# Patient Record
Sex: Male | Born: 1975
Health system: Southern US, Community
[De-identification: ages and names within clinical notes are randomized; demographics above are authoritative.]

## PROBLEM LIST (undated history)

## (undated) DIAGNOSIS — F32A Depression, unspecified: Secondary | ICD-10-CM

## (undated) DIAGNOSIS — F419 Anxiety disorder, unspecified: Secondary | ICD-10-CM

## (undated) DIAGNOSIS — Z87442 Personal history of urinary calculi: Secondary | ICD-10-CM

## (undated) DIAGNOSIS — F329 Major depressive disorder, single episode, unspecified: Secondary | ICD-10-CM

## (undated) HISTORY — DX: Anxiety disorder, unspecified: F41.9

## (undated) HISTORY — DX: Depression, unspecified: F32.A

## (undated) HISTORY — DX: Major depressive disorder, single episode, unspecified: F32.9

---

## 2005-01-14 ENCOUNTER — Ambulatory Visit: Payer: Self-pay | Admitting: Family Medicine

## 2005-03-14 ENCOUNTER — Ambulatory Visit: Payer: Self-pay | Admitting: Internal Medicine

## 2005-06-07 ENCOUNTER — Ambulatory Visit: Payer: Self-pay | Admitting: Family Medicine

## 2005-06-13 ENCOUNTER — Ambulatory Visit: Payer: Self-pay | Admitting: Family Medicine

## 2005-09-11 ENCOUNTER — Ambulatory Visit: Payer: Self-pay | Admitting: Family Medicine

## 2005-09-27 ENCOUNTER — Ambulatory Visit: Payer: Self-pay | Admitting: Family Medicine

## 2006-10-16 ENCOUNTER — Ambulatory Visit: Payer: Self-pay | Admitting: Family Medicine

## 2006-12-02 HISTORY — PX: LITHOTRIPSY: SUR834

## 2007-02-27 ENCOUNTER — Ambulatory Visit: Payer: Self-pay | Admitting: Family Medicine

## 2007-10-22 ENCOUNTER — Ambulatory Visit: Payer: Self-pay | Admitting: Family Medicine

## 2008-09-21 ENCOUNTER — Ambulatory Visit: Payer: Self-pay | Admitting: Family Medicine

## 2008-09-22 ENCOUNTER — Ambulatory Visit: Payer: Self-pay | Admitting: Family Medicine

## 2008-09-22 DIAGNOSIS — R1011 Right upper quadrant pain: Secondary | ICD-10-CM

## 2008-09-22 DIAGNOSIS — Z87448 Personal history of other diseases of urinary system: Secondary | ICD-10-CM

## 2008-09-22 LAB — CONVERTED CEMR LAB
Glucose, Urine, Semiquant: NEGATIVE
Nitrite: NEGATIVE
Urobilinogen, UA: 1
WBC Urine, dipstick: NEGATIVE
pH: 5

## 2008-09-24 ENCOUNTER — Encounter: Payer: Self-pay | Admitting: Family Medicine

## 2008-09-27 ENCOUNTER — Encounter (INDEPENDENT_AMBULATORY_CARE_PROVIDER_SITE_OTHER): Payer: Self-pay | Admitting: *Deleted

## 2008-09-27 LAB — CONVERTED CEMR LAB
ALT: 18 units/L (ref 0–53)
AST: 18 units/L (ref 0–37)
Albumin: 4.2 g/dL (ref 3.5–5.2)
Alkaline Phosphatase: 67 units/L (ref 39–117)
BUN: 17 mg/dL (ref 6–23)
Calcium: 9.4 mg/dL (ref 8.4–10.5)
GFR calc Af Amer: 99 mL/min
Glucose, Bld: 75 mg/dL (ref 70–99)
Hemoglobin: 15.6 g/dL (ref 13.0–17.0)
Monocytes Absolute: 0.5 10*3/uL (ref 0.1–1.0)
Neutrophils Relative %: 53.4 % (ref 43.0–77.0)
Potassium: 4 meq/L (ref 3.5–5.1)
RBC: 5.01 M/uL (ref 4.22–5.81)
Total Bilirubin: 1 mg/dL (ref 0.3–1.2)
Total Protein: 7.2 g/dL (ref 6.0–8.3)

## 2008-09-28 ENCOUNTER — Encounter: Admission: RE | Admit: 2008-09-28 | Discharge: 2008-09-28 | Payer: Self-pay | Admitting: Family Medicine

## 2008-09-29 ENCOUNTER — Telehealth: Payer: Self-pay | Admitting: Family Medicine

## 2008-09-29 DIAGNOSIS — N200 Calculus of kidney: Secondary | ICD-10-CM

## 2008-10-11 ENCOUNTER — Encounter: Payer: Self-pay | Admitting: Family Medicine

## 2008-11-03 ENCOUNTER — Encounter: Payer: Self-pay | Admitting: Family Medicine

## 2008-12-05 ENCOUNTER — Ambulatory Visit (HOSPITAL_COMMUNITY): Admission: RE | Admit: 2008-12-05 | Discharge: 2008-12-05 | Payer: Self-pay | Admitting: Urology

## 2008-12-13 ENCOUNTER — Encounter: Payer: Self-pay | Admitting: Family Medicine

## 2008-12-14 ENCOUNTER — Encounter: Payer: Self-pay | Admitting: Family Medicine

## 2008-12-16 ENCOUNTER — Encounter: Payer: Self-pay | Admitting: Family Medicine

## 2009-07-25 ENCOUNTER — Encounter: Payer: Self-pay | Admitting: Family Medicine

## 2009-09-14 ENCOUNTER — Ambulatory Visit: Payer: Self-pay | Admitting: Family Medicine

## 2009-12-04 ENCOUNTER — Ambulatory Visit: Payer: Self-pay | Admitting: Diagnostic Radiology

## 2009-12-04 ENCOUNTER — Ambulatory Visit: Payer: Self-pay | Admitting: Family Medicine

## 2009-12-04 ENCOUNTER — Ambulatory Visit (HOSPITAL_BASED_OUTPATIENT_CLINIC_OR_DEPARTMENT_OTHER): Admission: RE | Admit: 2009-12-04 | Discharge: 2009-12-04 | Payer: Self-pay | Admitting: Family Medicine

## 2009-12-04 ENCOUNTER — Telehealth (INDEPENDENT_AMBULATORY_CARE_PROVIDER_SITE_OTHER): Payer: Self-pay | Admitting: *Deleted

## 2009-12-04 DIAGNOSIS — M25569 Pain in unspecified knee: Secondary | ICD-10-CM | POA: Insufficient documentation

## 2010-08-22 ENCOUNTER — Encounter: Payer: Self-pay | Admitting: Family Medicine

## 2010-09-11 ENCOUNTER — Ambulatory Visit: Payer: Self-pay | Admitting: Family Medicine

## 2011-01-01 NOTE — Assessment & Plan Note (Signed)
Summary: FLU SHOT//KN  Nurse Visit   Allergies: No Known Drug Allergies  Orders Added: 1)  Admin 1st Vaccine [90471] 2)  Flu Vaccine 85yrs + [57846] Flu Vaccine Consent Questions     Do you have a history of severe allergic reactions to this vaccine? no    Any prior history of allergic reactions to egg and/or gelatin? no    Do you have a sensitivity to the preservative Thimersol? no    Do you have a past history of Guillan-Barre Syndrome? no    Do you currently have an acute febrile illness? no    Have you ever had a severe reaction to latex? no    Vaccine information given and explained to patient? yes    Are you currently pregnant? no    Lot Number:[id number]   Exp Date:06/01/2011   Site Given  Right Deltoid IM .lbflu

## 2011-01-01 NOTE — Assessment & Plan Note (Signed)
Summary: pain in knee/kdc ok per felecia   Vital Signs:  Patient profile:   35 year old male Height:      70 inches Weight:      221.38 pounds BMI:     31.88 Temp:     98.3 degrees F oral Pulse rate:   85 / minute Pulse rhythm:   regular BP sitting:   130 / 82  (left arm) Cuff size:   large  Vitals Entered By: Army Fossa CMA (December 04, 2009 10:00 AM) CC: Pt fell on new years eve down the stairs, his right knee is now very sore.    History of Present Illness:  Injury      This is a 35 year old man who presents with An injury.  The symptoms began 4 days ago.  Pt fell Friday am---slipped down 4 carpeted steps.  Pt fell on bottom twisted Left foot and ankle.  He saw Podiatrist this am for that.  Pt also c/o R knee pain. It has bothered him for 6 months but was worsened by fall.  The patient reports injury to the right knee and left foot, but denies injury to the head, face, neck, left arm, right arm, left elbow, right elbow, left forearm, right forearm, chest, back, abdomen, left hip, right hip, left thigh, right thigh, left knee, left leg, right leg, left ankle, right ankle, and right foot.  The patient also reports swelling.  The patient denies redness, tenderness, increased warmth deformity, blood loss, numbness, weakness, loss of sensation, coolness of extremity, and loss of consciousness.  The patient denies the following risk factors for significant bleeding: aspirin use, anticoagulant use, and history of bleeding disorder.    Current Medications (verified): 1)  None  Allergies (verified): No Known Drug Allergies  Past History:  Past medical, surgical, family and social histories (including risk factors) reviewed for relevance to current acute and chronic problems.  Family History: Reviewed history and no changes required.  Social History: Reviewed history from 09/22/2008 and no changes required. Married Never Smoked Drug use-no  Review of Systems      See  HPI  Physical Exam  General:  Well-developed,well-nourished,in no acute distress; alert,appropriate and cooperative throughout examination Msk:  decreased flexion R knee but only slightly affected when compared to L knee no joint tenderness, no joint warmth, no redness over joints, no joint deformities, and no crepitation.   + minimal swelling lateral R knee    + ecchymosis L big toe--- pt saw podiatrist this am Extremities:  No clubbing, cyanosis, edema, or deformity noted with normal full range of motion of all joints.     Impression & Recommendations:  Problem # 1:  KNEE PAIN, RIGHT (ICD-719.46)  Orders: T-Knee Right 2 view (16109UE) Orthopedic Surgeon Referral (Ortho Surgeon) Knee Orthosis Elastic Knee Cap [phone])  Discussed strengthening exercises, use of ice or heat, and medications.

## 2011-01-01 NOTE — Progress Notes (Signed)
Summary: knee injury  Phone Note Call from Patient Call back at Home Phone [phone]   Caller: Patient Summary of Call: Pt left VM that he has had a recent foot and knee injury and would like to see dr Laury Axon today. pt states that he has a podiatry that he is going to see today about the foot but would like to get in today to have dr lowne look at his knee.................Marland KitchenFelecia Deloach CMA  December 04, 2009 8:49 AM    pt coming in for OV today..............Marland KitchenFelecia Deloach CMA  December 04, 2009 8:51 AM

## 2011-01-01 NOTE — Letter (Signed)
Summary: [facility]  [facility]   Imported By: Lanelle Bal 09/03/2010 08:29:53  _____________________________________________________________________  External Attachment:    Type:   Image     Comment:   External Document

## 2011-04-16 NOTE — Op Note (Signed)
NAME:  Scott Sanchez, Scott Sanchez              ACCOUNT NO.:  [id number]   MEDICAL RECORD NO.:  [id number]          PATIENT TYPE:  AMB   LOCATION:  DAY                          FACILITY:  [facility]   PHYSICIAN:  Sigmund I. Patsi Sears, M.D.DATE OF BIRTH:  13-Aug-1976   DATE OF PROCEDURE:  12/05/2008  DATE OF DISCHARGE:                               OPERATIVE REPORT   PREOPERATIVE DIAGNOSIS:  Left renal pelvic stone.   POSTOPERATIVE DIAGNOSIS:  Left renal pelvic stone.   OPERATION:  Cystourethroscopy, left retrograde pyelogram with  interpretation, left double-J stent.   SURGEON:  Sigmund I. Patsi Sears, M.D.   ANESTHESIA:  General LMA.   PREPARATION:  After appropriate preanesthesia, the patient was brought  to the operating room, placed on the operating room in the dorsal supine  position where general LMA anesthesia was introduced.  He was then  replaced in the dorsal lithotomy position where the pubis was prepped  with Betadine solution and draped in the usual fashion.   REVIEW OF HISTORY:  Mr. Bisono is a 35 year old male with an 18 mm left  lower pole renal calculus, and 18-month history of left flank pain, and  recent onset of nausea, vomiting and gross hematuria.  He is now for  lithotripsy today with pre-lithotripsy double-J stent.   PROCEDURE:  Cystourethroscopy was accomplished, left retrograde  pyelograms was performed.  Left  retrograde shows no hydronephrosis, but  the patient was found to have a small intrarenal pelvis, with no  hydronephrosis.  The stone was identified on retrograde within the renal  pelvis, completely filling the renal pelvis.  The upper pole has a long  extended infundibulum, and the mid pole and lower pole both have short  infundibula.  A 6 x 26 double-J stent was passed into the renal pelvis,  and coiled in the bladder.  No bleeding was noted.  The patient was  given a B and O suppository.  He was awakened and taken to the recovery  room in good  condition.      Sigmund I. Patsi Sears, M.D.  Electronically Signed     SIT/MEDQ  D:  12/05/2008  T:  12/05/2008  Job:  [id number]

## 2011-10-02 IMAGING — CR DG KNEE 1-2V*R*
2 series · 2 of 2 positions shown · non-contrast
Comparison: None available.

CLINICAL DATA: Anterior knee pain since fall.

RIGHT KNEE - 1-2 VIEW

[t knee ap right]
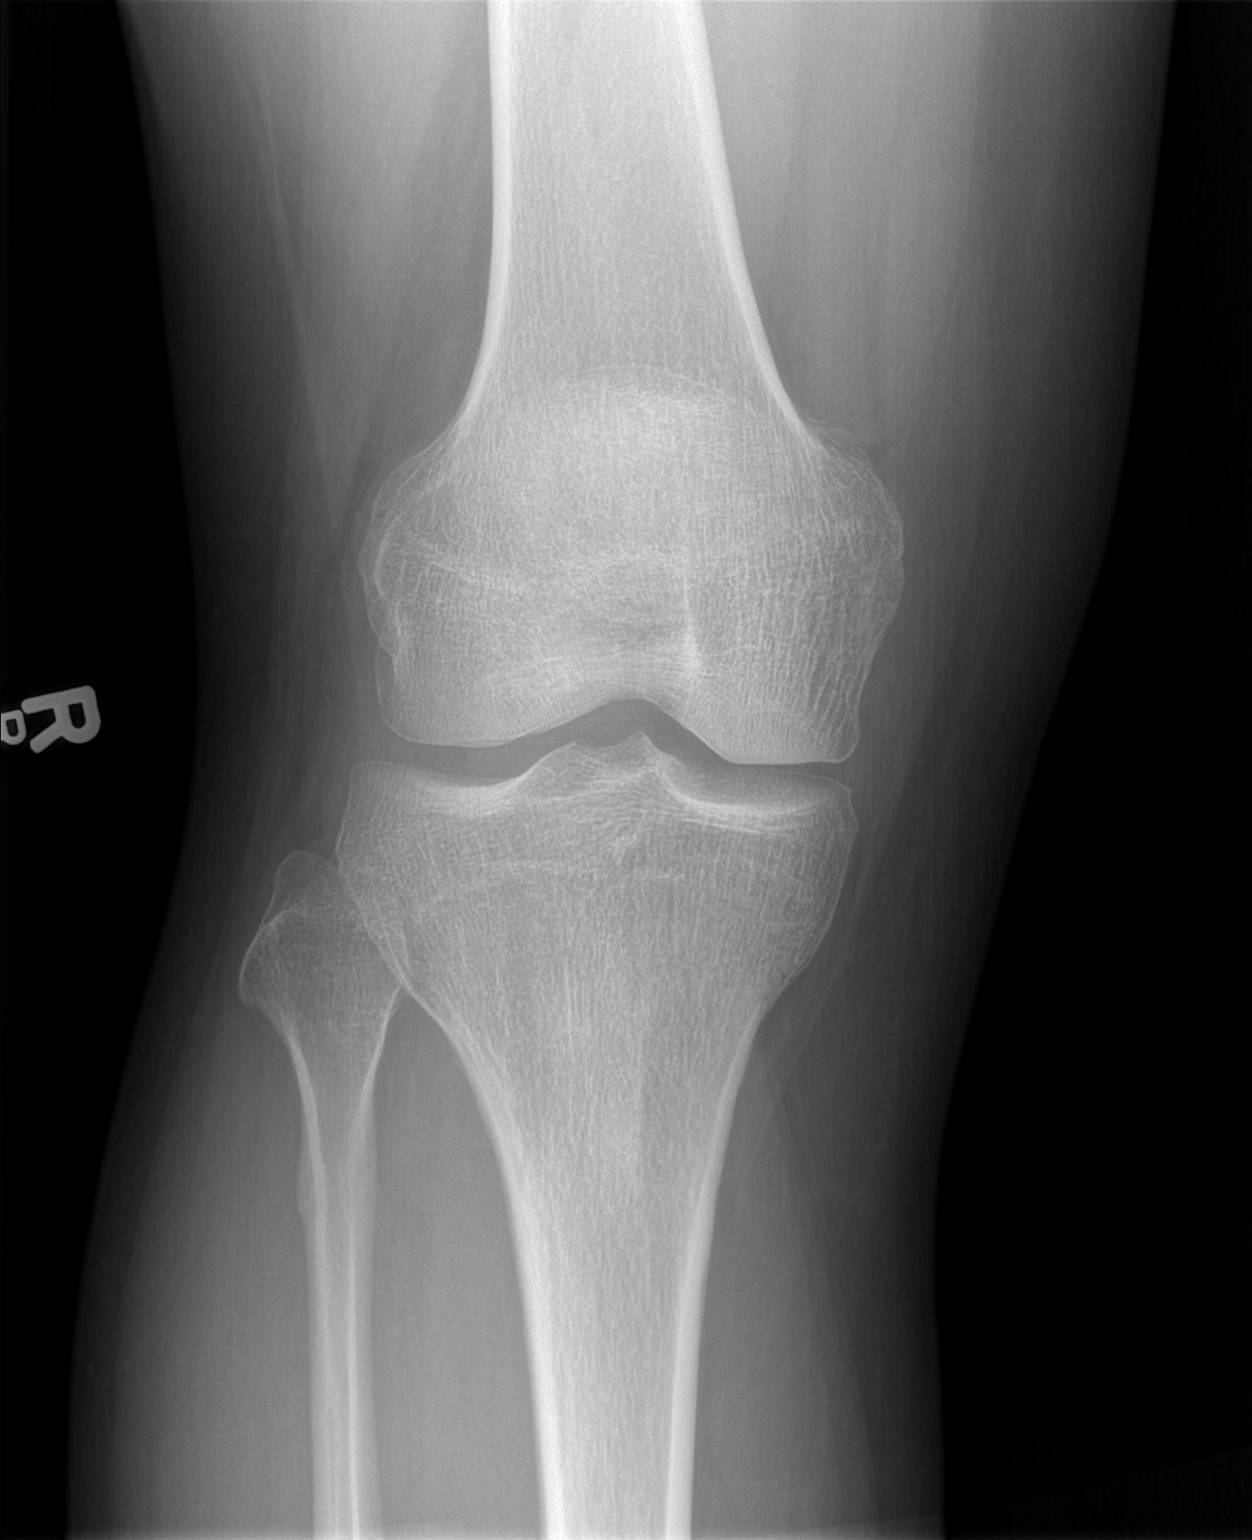

[t knee lat right]
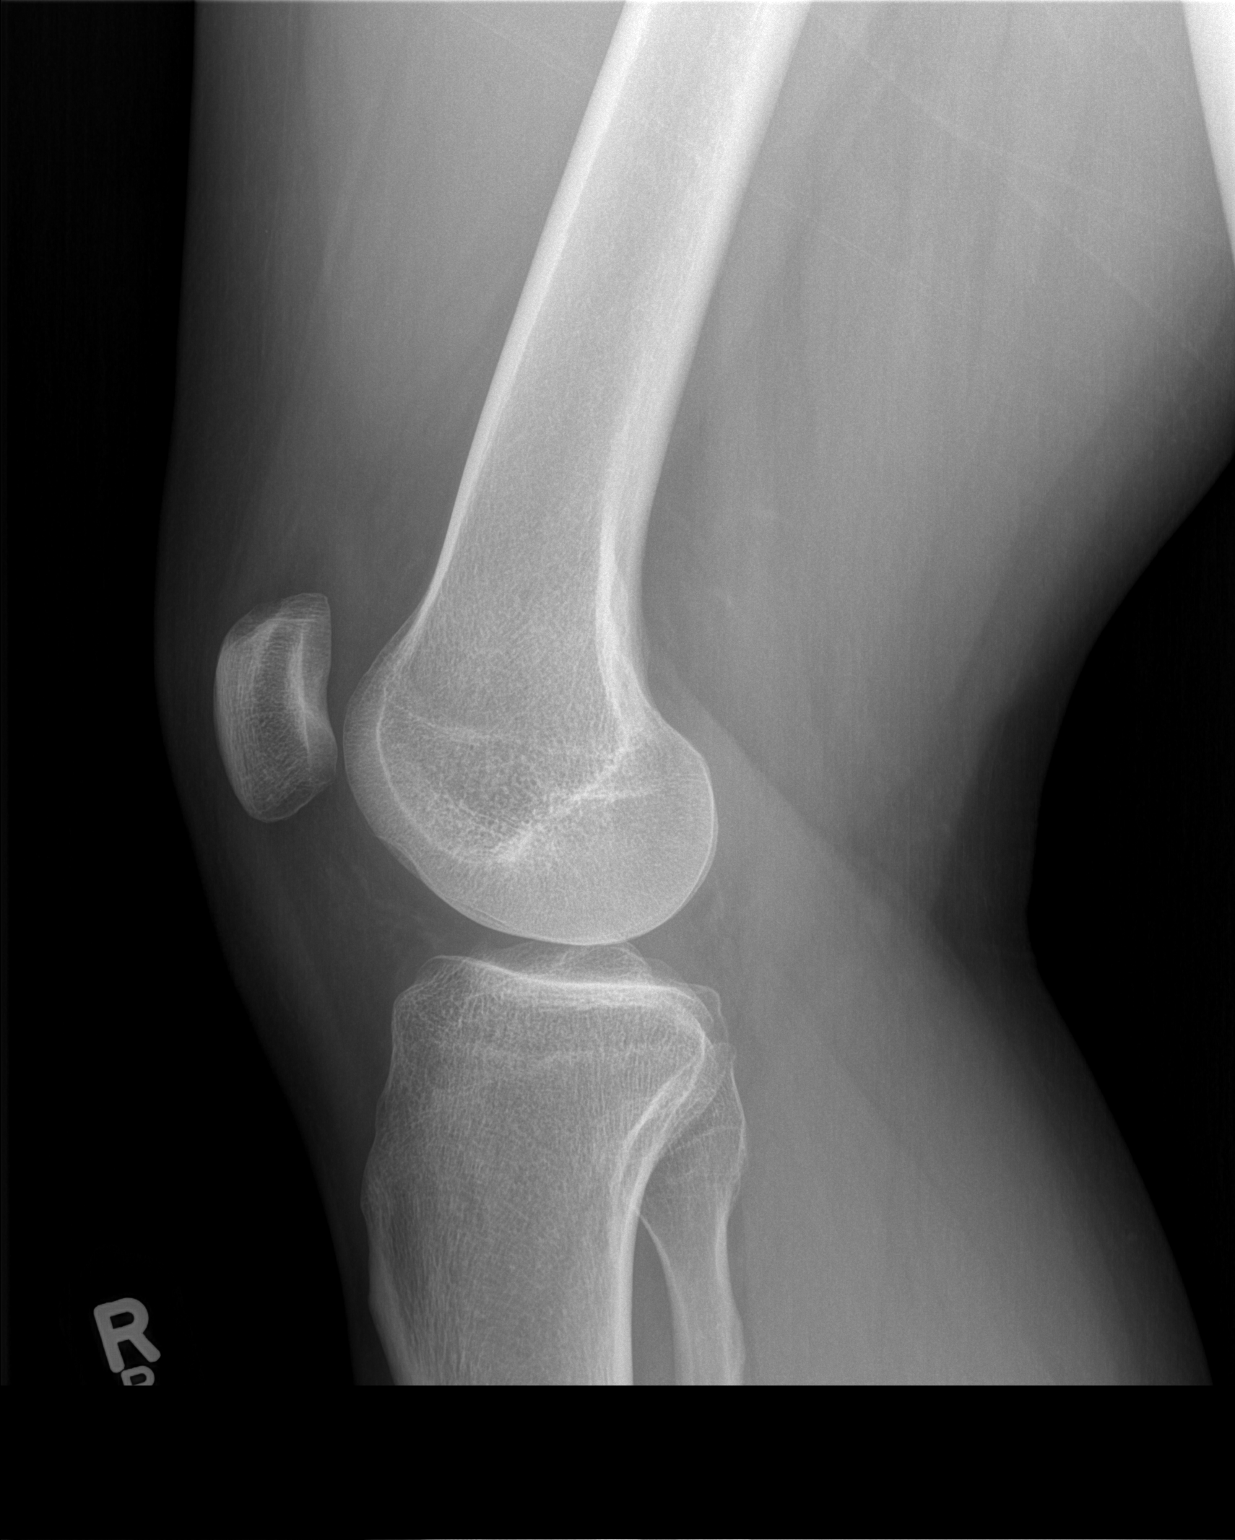

[2 of 2 positions shown; findings below may reference images not displayed]

FINDINGS: There is no fracture or subluxation.  No notable
degenerative change.  Small amount of joint fluid noted.
IMPRESSION: No acute finding.

## 2011-10-10 ENCOUNTER — Ambulatory Visit (INDEPENDENT_AMBULATORY_CARE_PROVIDER_SITE_OTHER): Payer: BC Managed Care – PPO

## 2011-10-10 DIAGNOSIS — Z23 Encounter for immunization: Secondary | ICD-10-CM

## 2012-06-02 ENCOUNTER — Encounter: Payer: Self-pay | Admitting: Family Medicine

## 2012-06-02 ENCOUNTER — Ambulatory Visit (INDEPENDENT_AMBULATORY_CARE_PROVIDER_SITE_OTHER): Payer: BC Managed Care – PPO | Admitting: Family Medicine

## 2012-06-02 VITALS — BP 114/68 | HR 65 | Temp 98.6°F | Ht 70.0 in | Wt 218.2 lb

## 2012-06-02 DIAGNOSIS — Z23 Encounter for immunization: Secondary | ICD-10-CM

## 2012-06-02 DIAGNOSIS — Z0000 Encounter for general adult medical examination without abnormal findings: Secondary | ICD-10-CM

## 2012-06-02 NOTE — Patient Instructions (Signed)
Preventive Care for Adults, Male A healthy lifestyle and preventative care can promote health and wellness. Preventative health guidelines for men include the following key practices:  A routine yearly physical is a good way to check with your caregiver about your health and preventative screening. It is a chance to share any concerns and updates on your health, and to receive a thorough exam.   Visit your dentist for a routine exam and preventative care every 6 months. Brush your teeth twice a day and floss once a day. Good oral hygiene prevents tooth decay and gum disease.   The frequency of eye exams is based on your age, health, family medical history, use of contact lenses, and other factors. Follow your caregiver's recommendations for frequency of eye exams.   Eat a healthy diet. Foods like vegetables, fruits, whole grains, low-fat dairy products, and lean protein foods contain the nutrients you need without too many calories. Decrease your intake of foods high in solid fats, added sugars, and salt. Eat the right amount of calories for you.Get information about a proper diet from your caregiver, if necessary.   Regular physical exercise is one of the most important things you can do for your health. Most adults should get at least 150 minutes of moderate-intensity exercise (any activity that increases your heart rate and causes you to sweat) each week. In addition, most adults need muscle-strengthening exercises on 2 or more days a week.   Maintain a healthy weight. The body mass index (BMI) is a screening tool to identify possible weight problems. It provides an estimate of body fat based on height and weight. Your caregiver can help determine your BMI, and can help you achieve or maintain a healthy weight.For adults 20 years and older:   A BMI below 18.5 is considered underweight.   A BMI of 18.5 to 24.9 is normal.   A BMI of 25 to 29.9 is considered overweight.   A BMI of 30 and above  is considered obese.   Maintain normal blood lipids and cholesterol levels by exercising and minimizing your intake of saturated fat. Eat a balanced diet with plenty of fruit and vegetables. Blood tests for lipids and cholesterol should begin at age 20 and be repeated every 5 years. If your lipid or cholesterol levels are high, you are over 50, or you are a high risk for heart disease, you may need your cholesterol levels checked more frequently.Ongoing high lipid and cholesterol levels should be treated with medicines if diet and exercise are not effective.   If you smoke, find out from your caregiver how to quit. If you do not use tobacco, do not start.   If you choose to drink alcohol, do not exceed 2 drinks per day. One drink is considered to be 12 ounces (355 mL) of beer, 5 ounces (148 mL) of wine, or 1.5 ounces (44 mL) of liquor.   Avoid use of street drugs. Do not share needles with anyone. Ask for help if you need support or instructions about stopping the use of drugs.   High blood pressure causes heart disease and increases the risk of stroke. Your blood pressure should be checked at least every 1 to 2 years. Ongoing high blood pressure should be treated with medicines, if weight loss and exercise are not effective.   If you are 45 to 38 years old, ask your caregiver if you should take aspirin to prevent heart disease.   Diabetes screening involves taking a blood   sample to check your fasting blood sugar level. This should be done once every 3 years, after age 45, if you are within normal weight and without risk factors for diabetes. Testing should be considered at a younger age or be carried out more frequently if you are overweight and have at least 1 risk factor for diabetes.   Colorectal cancer can be detected and often prevented. Most routine colorectal cancer screening begins at the age of 50 and continues through age 75. However, your caregiver may recommend screening at an earlier  age if you have risk factors for colon cancer. On a yearly basis, your caregiver may provide home test kits to check for hidden blood in the stool. Use of a small camera at the end of a tube, to directly examine the colon (sigmoidoscopy or colonoscopy), can detect the earliest forms of colorectal cancer. Talk to your caregiver about this at age 50, when routine screening begins. Direct examination of the colon should be repeated every 5 to 10 years through age 75, unless early forms of pre-cancerous polyps or small growths are found.   Hepatitis C blood testing is recommended for all people born from 1945 through 1965 and any individual with known risks for hepatitis C.   Practice safe sex. Use condoms and avoid high-risk sexual practices to reduce the spread of sexually transmitted infections (STIs). STIs include gonorrhea, chlamydia, syphilis, trichomonas, herpes, HPV, and human immunodeficiency virus (HIV). Herpes, HIV, and HPV are viral illnesses that have no cure. They can result in disability, cancer, and death.   A one-time screening for abdominal aortic aneurysm (AAA) and surgical repair of large AAAs by sound wave imaging (ultrasonography) is recommended for ages 65 to 75 years who are current or former smokers.   Healthy men should no longer receive prostate-specific antigen (PSA) blood tests as part of routine cancer screening. Consult with your caregiver about prostate cancer screening.   Testicular cancer screening is not recommended for adult males who have no symptoms. Screening includes self-exam, caregiver exam, and other screening tests. Consult with your caregiver about any symptoms you have or any concerns you have about testicular cancer.   Use sunscreen with skin protection factor (SPF) of 30 or more. Apply sunscreen liberally and repeatedly throughout the day. You should seek shade when your shadow is shorter than you. Protect yourself by wearing long sleeves, pants, a  wide-brimmed hat, and sunglasses year round, whenever you are outdoors.   Once a month, do a whole body skin exam, using a mirror to look at the skin on your back. Notify your caregiver of new moles, moles that have irregular borders, moles that are larger than a pencil eraser, or moles that have changed in shape or color.   Stay current with required immunizations.   Influenza. You need a dose every fall (or winter). The composition of the flu vaccine changes each year, so being vaccinated once is not enough.   Pneumococcal polysaccharide. You need 1 to 2 doses if you smoke cigarettes or if you have certain chronic medical conditions. You need 1 dose at age 65 (or older) if you have never been vaccinated.   Tetanus, diphtheria, pertussis (Tdap, Td). Get 1 dose of Tdap vaccine if you are younger than age 65 years, are over 65 and have contact with an infant, are a healthcare worker, or simply want to be protected from whooping cough. After that, you need a Td booster dose every 10 years. Consult your caregiver if   you have not had at least 3 tetanus and diphtheria-containing shots sometime in your life or have a deep or dirty wound.   HPV. This vaccine is recommended for males 13 through 38 years of age. This vaccine may be given to men 22 through 38 years of age who have not completed the 3 dose series. It is recommended for men through age 26 who have sex with men or whose immune system is weakened because of HIV infection, other illness, or medications. The vaccine is given in 3 doses over 6 months.   Measles, mumps, rubella (MMR). You need at least 1 dose of MMR if you were born in 1957 or later. You may also need a 2nd dose.   Meningococcal. If you are age 19 to 21 years and a first-year college student living in a residence hall, or have one of several medical conditions, you need to get vaccinated against meningococcal disease. You may also need additional booster doses.   Zoster (shingles).  If you are age 60 years or older, you should get this vaccine.   Varicella (chickenpox). If you have never had chickenpox or you were vaccinated but received only 1 dose, talk to your caregiver to find out if you need this vaccine.   Hepatitis A. You need this vaccine if you have a specific risk factor for hepatitis A virus infection, or you simply wish to be protected from this disease. The vaccine is usually given as 2 doses, 6 to 18 months apart.   Hepatitis B. You need this vaccine if you have a specific risk factor for hepatitis B virus infection or you simply wish to be protected from this disease. The vaccine is given in 3 doses, usually over 6 months.  Preventative Service / Frequency Ages 19 to 39  Blood pressure check.** / Every 1 to 2 years.   Lipid and cholesterol check.** / Every 5 years beginning at age 20.   Hepatitis C blood test.** / For any individual with known risks for hepatitis C.   Skin self-exam. / Monthly.   Influenza immunization.** / Every year.   Pneumococcal polysaccharide immunization.** / 1 to 2 doses if you smoke cigarettes or if you have certain chronic medical conditions.   Tetanus, diphtheria, pertussis (Tdap,Td) immunization. / A one-time dose of Tdap vaccine. After that, you need a Td booster dose every 10 years.   HPV immunization. / 3 doses over 6 months, if 26 and younger.   Measles, mumps, rubella (MMR) immunization. / You need at least 1 dose of MMR if you were born in 1957 or later. You may also need a 2nd dose.   Meningococcal immunization. / 1 dose if you are age 19 to 21 years and a first-year college student living in a residence hall, or have one of several medical conditions, you need to get vaccinated against meningococcal disease. You may also need additional booster doses.   Varicella immunization.** / Consult your caregiver.   Hepatitis A immunization.** / Consult your caregiver. 2 doses, 6 to 18 months apart.   Hepatitis B  immunization.** / Consult your caregiver. 3 doses usually over 6 months.  Ages 40 to 64  Blood pressure check.** / Every 1 to 2 years.   Lipid and cholesterol check.** / Every 5 years beginning at age 20.   Fecal occult blood test (FOBT) of stool. / Every year beginning at age 50 and continuing until age 75. You may not have to do this test if   you get colonoscopy every 10 years.   Flexible sigmoidoscopy** or colonoscopy.** / Every 5 years for a flexible sigmoidoscopy or every 10 years for a colonoscopy beginning at age 50 and continuing until age 75.   Hepatitis C blood test.** / For all people born from 1945 through 1965 and any individual with known risks for hepatitis C.   Skin self-exam. / Monthly.   Influenza immunization.** / Every year.   Pneumococcal polysaccharide immunization.** / 1 to 2 doses if you smoke cigarettes or if you have certain chronic medical conditions.   Tetanus, diphtheria, pertussis (Tdap/Td) immunization.** / A one-time dose of Tdap vaccine. After that, you need a Td booster dose every 10 years.   Measles, mumps, rubella (MMR) immunization. / You need at least 1 dose of MMR if you were born in 1957 or later. You may also need a 2nd dose.   Varicella immunization.**/ Consult your caregiver.   Meningococcal immunization.** / Consult your caregiver.   Hepatitis A immunization.** / Consult your caregiver. 2 doses, 6 to 18 months apart.   Hepatitis B immunization.** / Consult your caregiver. 3 doses, usually over 6 months.  Ages 65 and over  Blood pressure check.** / Every 1 to 2 years.   Lipid and cholesterol check.**/ Every 5 years beginning at age 20.   Fecal occult blood test (FOBT) of stool. / Every year beginning at age 50 and continuing until age 75. You may not have to do this test if you get colonoscopy every 10 years.   Flexible sigmoidoscopy** or colonoscopy.** / Every 5 years for a flexible sigmoidoscopy or every 10 years for a colonoscopy  beginning at age 50 and continuing until age 75.   Hepatitis C blood test.** / For all people born from 1945 through 1965 and any individual with known risks for hepatitis C.   Abdominal aortic aneurysm (AAA) screening.** / A one-time screening for ages 65 to 75 years who are current or former smokers.   Skin self-exam. / Monthly.   Influenza immunization.** / Every year.   Pneumococcal polysaccharide immunization.** / 1 dose at age 65 (or older) if you have never been vaccinated.   Tetanus, diphtheria, pertussis (Tdap, Td) immunization. / A one-time dose of Tdap vaccine if you are over 65 and have contact with an infant, are a healthcare worker, or simply want to be protected from whooping cough. After that, you need a Td booster dose every 10 years.   Varicella immunization. ** / Consult your caregiver.   Meningococcal immunization.** / Consult your caregiver.   Hepatitis A immunization. ** / Consult your caregiver. 2 doses, 6 to 18 months apart.   Hepatitis B immunization.** / Check with your caregiver. 3 doses, usually over 6 months.  **Family history and personal history of risk and conditions may change your caregiver's recommendations. Document Released: 01/14/2002 Document Revised: 11/07/2011 Document Reviewed: 04/15/2011 [employer] Patient Information 2012 [employer]. 

## 2012-06-02 NOTE — Progress Notes (Signed)
Subjective:    Patient ID: Scott Sanchez, male    DOB: [birth year], 36 y.o.   MRN: [id number]  HPI Pt here for cpe -- no complaints.     Review of Systems    Review of Systems  Constitutional: Negative for activity change, appetite change and fatigue.  HENT: Negative for hearing loss, congestion, tinnitus and ear discharge.  dentist q85m Eyes: Negative for visual disturbance (see optho q1y -- vision corrected to 20/20 with glasses).  Respiratory: Negative for cough, chest tightness and shortness of breath.   Cardiovascular: Negative for chest pain, palpitations and leg swelling.  Gastrointestinal: Negative for abdominal pain, diarrhea, constipation and abdominal distention.  Genitourinary: Negative for urgency, frequency, decreased urine volume and difficulty urinating.  Musculoskeletal: Negative for back pain, arthralgias and gait problem.  Skin: Negative for color change, pallor and rash.  Neurological: Negative for dizziness, light-headedness, numbness and headaches.  Hematological: Negative for adenopathy. Does not bruise/bleed easily.  Psychiatric/Behavioral: Negative for suicidal ideas, confusion, sleep disturbance, self-injury, dysphoric mood, decreased concentration and agitation.   History reviewed. No pertinent past medical history. History   Social History  . Marital Status: Married    Spouse Name: N/A    Number of Children: N/A  . Years of Education: N/A   Occupational History  . [employer]    Social History Main Topics  . Smoking status: Never Smoker   . Smokeless tobacco: Never Used  . Alcohol Use: No  . Drug Use: No  . Sexually Active: Yes -- Male partner(s)   Other Topics Concern  . Not on file   Social History Narrative   Exercise-- no   History reviewed. No pertinent family history. No current outpatient prescriptions on file prior to visit.      Objective:   Physical Exam  BP 114/68  Pulse 65  Temp 98.6 F (37 C) (Oral)  Ht 5'  10" (1.778 m)  Wt 218 lb 3.2 oz (98.975 kg)  BMI 31.31 kg/m2  SpO2 97%  General Appearance:    Alert, cooperative, no distress, appears stated age  Head:    Normocephalic, without obvious abnormality, atraumatic  Eyes:    PERRL, conjunctiva/corneas clear, EOM's intact, fundi    benign, both eyes       Ears:    Normal TM's and external ear canals, both ears  Nose:   Nares normal, septum midline, mucosa normal, no drainage   or sinus tenderness  Throat:   Lips, mucosa, and tongue normal; teeth and gums normal  Neck:   Supple, symmetrical, trachea midline, no adenopathy;       thyroid:  No enlargement/tenderness/nodules; no carotid   bruit or JVD  Back:     Symmetric, no curvature, ROM normal, no CVA tenderness  Lungs:     Clear to auscultation bilaterally, respirations unlabored  Chest wall:    No tenderness or deformity  Heart:    Regular rate and rhythm, S1 and S2 normal, no murmur, rub   or gallop  Abdomen:     Soft, non-tender, bowel sounds active all four quadrants,    no masses, no organomegaly  Genitalia:    Normal male without lesion, discharge or tenderness  Rectal:    deferred  Extremities:   Extremities normal, atraumatic, no cyanosis or edema  Pulses:   2+ and symmetric all extremities  Skin:   Skin color, texture, turgor normal, no rashes or lesions  Lymph nodes:   Cervical, supraclavicular, and axillary nodes normal  Neurologic:   CNII-XII intact. Normal strength, sensation and reflexes      throughout        Assessment & Plan:  1.  cpe--   Check fasting labs                   ghm utd

## 2012-06-03 ENCOUNTER — Other Ambulatory Visit (INDEPENDENT_AMBULATORY_CARE_PROVIDER_SITE_OTHER): Payer: BC Managed Care – PPO

## 2012-06-03 DIAGNOSIS — Z0000 Encounter for general adult medical examination without abnormal findings: Secondary | ICD-10-CM

## 2012-06-03 LAB — URINALYSIS, ROUTINE W REFLEX MICROSCOPIC
Bilirubin Urine: NEGATIVE
Ketones, ur: NEGATIVE
Leukocytes, UA: NEGATIVE
Nitrite: NEGATIVE
Total Protein, Urine: NEGATIVE
Urine Glucose: NEGATIVE
Urobilinogen, UA: 0.2 (ref 0.0–1.0)

## 2012-06-03 LAB — CBC WITH DIFFERENTIAL/PLATELET
Eosinophils Absolute: 0.4 10*3/uL (ref 0.0–0.7)
Lymphocytes Relative: 31 % (ref 12.0–46.0)
MCV: 89 fl (ref 78.0–100.0)
Monocytes Absolute: 0.4 10*3/uL (ref 0.1–1.0)
Monocytes Relative: 8.5 % (ref 3.0–12.0)
Neutro Abs: 2.2 10*3/uL (ref 1.4–7.7)
RBC: 5.2 Mil/uL (ref 4.22–5.81)
RDW: 13.2 % (ref 11.5–14.6)

## 2012-06-03 LAB — BASIC METABOLIC PANEL
BUN: 14 mg/dL (ref 6–23)
CO2: 25 mEq/L (ref 19–32)
Chloride: 102 mEq/L (ref 96–112)
Glucose, Bld: 87 mg/dL (ref 70–99)
Potassium: 3.8 mEq/L (ref 3.5–5.1)
Sodium: 139 mEq/L (ref 135–145)

## 2012-06-03 LAB — LIPID PANEL
Cholesterol: 149 mg/dL (ref 0–200)
HDL: 45.5 mg/dL (ref >39.00)
Triglycerides: 65 mg/dL (ref 0.0–149.0)
VLDL: 13 mg/dL (ref 0.0–40.0)

## 2012-06-03 LAB — HEPATIC FUNCTION PANEL
Albumin: 4.2 g/dL (ref 3.5–5.2)
Bilirubin, Direct: 0.1 mg/dL (ref 0.0–0.3)

## 2012-09-02 ENCOUNTER — Ambulatory Visit (INDEPENDENT_AMBULATORY_CARE_PROVIDER_SITE_OTHER): Payer: BC Managed Care – PPO

## 2012-09-02 DIAGNOSIS — Z23 Encounter for immunization: Secondary | ICD-10-CM

## 2013-03-30 ENCOUNTER — Encounter: Payer: Self-pay | Admitting: Family Medicine

## 2013-03-30 ENCOUNTER — Ambulatory Visit (INDEPENDENT_AMBULATORY_CARE_PROVIDER_SITE_OTHER): Payer: BC Managed Care – PPO | Admitting: Family Medicine

## 2013-03-30 VITALS — BP 114/70 | HR 75 | Temp 98.7°F | Ht 69.5 in | Wt 221.2 lb

## 2013-03-30 DIAGNOSIS — Z0000 Encounter for general adult medical examination without abnormal findings: Secondary | ICD-10-CM

## 2013-03-30 NOTE — Progress Notes (Signed)
  Subjective:    Patient ID: Scott Sanchez, male    DOB: October 22, 1976, 37 y.o.   MRN: [id number]  HPI Pt here for cpe and to schedule labs No complaints Also need [employer] forms filled out.    Review of Systems.   Review of Systems  Constitutional: Negative for activity change, appetite change and fatigue.  HENT: Negative for hearing loss, congestion, tinnitus and ear discharge.  dentist q64m Eyes: Negative for visual disturbance (see optho q1y -- vision corrected to 20/20 with glasses).  Respiratory: Negative for cough, chest tightness and shortness of breath.   Cardiovascular: Negative for chest pain, palpitations and leg swelling.  Gastrointestinal: Negative for abdominal pain, diarrhea, constipation and abdominal distention.  Genitourinary: Negative for urgency, frequency, decreased urine volume and difficulty urinating.  Musculoskeletal: Negative for back pain, arthralgias and gait problem.  Skin: Negative for color change, pallor and rash.  Neurological: Negative for dizziness, light-headedness, numbness and headaches.  Hematological: Negative for adenopathy. Does not bruise/bleed easily.  Psychiatric/Behavioral: Negative for suicidal ideas, confusion, sleep disturbance, self-injury, dysphoric mood, decreased concentration and agitation.   No past medical history on file. History   Social History  . Marital Status: Married    Spouse Name: N/A    Number of Children: N/A  . Years of Education: N/A   Occupational History  . [employer]    Social History Main Topics  . Smoking status: Never Smoker   . Smokeless tobacco: Never Used  . Alcohol Use: No  . Drug Use: No  . Sexually Active: Yes -- Male partner(s)   Other Topics Concern  . Not on file   Social History Narrative   Exercise-- no  No current outpatient prescriptions on file. Past Surgical History  Procedure Laterality Date  . Kidney stone surgery  y-3      Objective:   Physical Exam  BP 114/70   Pulse 75  Temp(Src) 98.7 F (37.1 C) (Oral)  Ht 5' 9.5" (1.765 m)  Wt 221 lb 3.2 oz (100.336 kg)  BMI 32.21 kg/m2  SpO2 98% General appearance: alert, cooperative, appears stated age and no distress Head: Normocephalic, without obvious abnormality, atraumatic Eyes: conjunctivae/corneas clear. PERRL, EOM's intact. Fundi benign. Ears: normal TM's and external ear canals both ears Nose: Nares normal. Septum midline. Mucosa normal. No drainage or sinus tenderness. Throat: lips, mucosa, and tongue normal; teeth and gums normal Neck: no adenopathy, supple, symmetrical, trachea midline and thyroid not enlarged, symmetric, no tenderness/mass/nodules Back: symmetric, no curvature. ROM normal. No CVA tenderness. Lungs: clear to auscultation bilaterally Chest wall: no tenderness Heart: regular rate and rhythm, S1, S2 normal, no murmur, click, rub or gallop Abdomen: soft, non-tender; bowel sounds normal; no masses,  no organomegaly Male genitalia: penis: no lesions or discharge. testes: no masses or tenderness. no hernias Rectal: deferred Extremities: extremities normal, atraumatic, no cyanosis or edema Pulses: 2+ and symmetric Skin: Skin color, texture, turgor normal. No rashes or lesions Lymph nodes: Cervical, supraclavicular, and axillary nodes normal. Neurologic: Alert and oriented X 3, normal strength and tone. Normal symmetric reflexes. Normal coordination and gait Psych-- no depression, no anxiety       Assessment & Plan:  cpe-- check labs           ghm utd           See avs

## 2013-03-30 NOTE — Patient Instructions (Signed)
Preventive Care for Adults, Male A healthy lifestyle and preventive care can promote health and wellness. Preventive health guidelines for men include the following key practices:  A routine yearly physical is a good way to check with your caregiver about your health and preventative screening. It is a chance to share any concerns and updates on your health, and to receive a thorough exam.  Visit your dentist for a routine exam and preventative care every 6 months. Brush your teeth twice a day and floss once a day. Good oral hygiene prevents tooth decay and gum disease.  The frequency of eye exams is based on your age, health, family medical history, use of contact lenses, and other factors. Follow your caregiver's recommendations for frequency of eye exams.  Eat a healthy diet. Foods like vegetables, fruits, whole grains, low-fat dairy products, and lean protein foods contain the nutrients you need without too many calories. Decrease your intake of foods high in solid fats, added sugars, and salt. Eat the right amount of calories for you.Get information about a proper diet from your caregiver, if necessary.  Regular physical exercise is one of the most important things you can do for your health. Most adults should get at least 150 minutes of moderate-intensity exercise (any activity that increases your heart rate and causes you to sweat) each week. In addition, most adults need muscle-strengthening exercises on 2 or more days a week.  Maintain a healthy weight. The body mass index (BMI) is a screening tool to identify possible weight problems. It provides an estimate of body fat based on height and weight. Your caregiver can help determine your BMI, and can help you achieve or maintain a healthy weight.For adults 20 years and older:  A BMI below 18.5 is considered underweight.  A BMI of 18.5 to 24.9 is normal.  A BMI of 25 to 29.9 is considered overweight.  A BMI of 30 and above is  considered obese.  Maintain normal blood lipids and cholesterol levels by exercising and minimizing your intake of saturated fat. Eat a balanced diet with plenty of fruit and vegetables. Blood tests for lipids and cholesterol should begin at age 20 and be repeated every 5 years. If your lipid or cholesterol levels are high, you are over 50, or you are a high risk for heart disease, you may need your cholesterol levels checked more frequently.Ongoing high lipid and cholesterol levels should be treated with medicines if diet and exercise are not effective.  If you smoke, find out from your caregiver how to quit. If you do not use tobacco, do not start.  If you choose to drink alcohol, do not exceed 2 drinks per day. One drink is considered to be 12 ounces (355 mL) of beer, 5 ounces (148 mL) of wine, or 1.5 ounces (44 mL) of liquor.  Avoid use of street drugs. Do not share needles with anyone. Ask for help if you need support or instructions about stopping the use of drugs.  High blood pressure causes heart disease and increases the risk of stroke. Your blood pressure should be checked at least every 1 to 2 years. Ongoing high blood pressure should be treated with medicines, if weight loss and exercise are not effective.  If you are 45 to 39 years old, ask your caregiver if you should take aspirin to prevent heart disease.  Diabetes screening involves taking a blood sample to check your fasting blood sugar level. This should be done once every 3 years,   after age 45, if you are within normal weight and without risk factors for diabetes. Testing should be considered at a younger age or be carried out more frequently if you are overweight and have at least 1 risk factor for diabetes.  Colorectal cancer can be detected and often prevented. Most routine colorectal cancer screening begins at the age of 50 and continues through age 75. However, your caregiver may recommend screening at an earlier age if you  have risk factors for colon cancer. On a yearly basis, your caregiver may provide home test kits to check for hidden blood in the stool. Use of a small camera at the end of a tube, to directly examine the colon (sigmoidoscopy or colonoscopy), can detect the earliest forms of colorectal cancer. Talk to your caregiver about this at age 50, when routine screening begins. Direct examination of the colon should be repeated every 5 to 10 years through age 75, unless early forms of pre-cancerous polyps or small growths are found.  Hepatitis C blood testing is recommended for all people born from 1945 through 1965 and any individual with known risks for hepatitis C.  Practice safe sex. Use condoms and avoid high-risk sexual practices to reduce the spread of sexually transmitted infections (STIs). STIs include gonorrhea, chlamydia, syphilis, trichomonas, herpes, HPV, and human immunodeficiency virus (HIV). Herpes, HIV, and HPV are viral illnesses that have no cure. They can result in disability, cancer, and death.  A one-time screening for abdominal aortic aneurysm (AAA) and surgical repair of large AAAs by sound wave imaging (ultrasonography) is recommended for ages 65 to 75 years who are current or former smokers.  Healthy men should no longer receive prostate-specific antigen (PSA) blood tests as part of routine cancer screening. Consult with your caregiver about prostate cancer screening.  Testicular cancer screening is not recommended for adult males who have no symptoms. Screening includes self-exam, caregiver exam, and other screening tests. Consult with your caregiver about any symptoms you have or any concerns you have about testicular cancer.  Use sunscreen with skin protection factor (SPF) of 30 or more. Apply sunscreen liberally and repeatedly throughout the day. You should seek shade when your shadow is shorter than you. Protect yourself by wearing long sleeves, pants, a wide-brimmed hat, and  sunglasses year round, whenever you are outdoors.  Once a month, do a whole body skin exam, using a mirror to look at the skin on your back. Notify your caregiver of new moles, moles that have irregular borders, moles that are larger than a pencil eraser, or moles that have changed in shape or color.  Stay current with required immunizations.  Influenza. You need a dose every fall (or winter). The composition of the flu vaccine changes each year, so being vaccinated once is not enough.  Pneumococcal polysaccharide. You need 1 to 2 doses if you smoke cigarettes or if you have certain chronic medical conditions. You need 1 dose at age 65 (or older) if you have never been vaccinated.  Tetanus, diphtheria, pertussis (Tdap, Td). Get 1 dose of Tdap vaccine if you are younger than age 65 years, are over 65 and have contact with an infant, are a healthcare worker, or simply want to be protected from whooping cough. After that, you need a Td booster dose every 10 years. Consult your caregiver if you have not had at least 3 tetanus and diphtheria-containing shots sometime in your life or have a deep or dirty wound.  HPV. This vaccine is recommended   for males 13 through 39 years of age. This vaccine may be given to men 22 through 39 years of age who have not completed the 3 dose series. It is recommended for men through age 26 who have sex with men or whose immune system is weakened because of HIV infection, other illness, or medications. The vaccine is given in 3 doses over 6 months.  Measles, mumps, rubella (MMR). You need at least 1 dose of MMR if you were born in 1957 or later. You may also need a 2nd dose.  Meningococcal. If you are age 19 to 21 years and a first-year college student living in a residence hall, or have one of several medical conditions, you need to get vaccinated against meningococcal disease. You may also need additional booster doses.  Zoster (shingles). If you are age 60 years or  older, you should get this vaccine.  Varicella (chickenpox). If you have never had chickenpox or you were vaccinated but received only 1 dose, talk to your caregiver to find out if you need this vaccine.  Hepatitis A. You need this vaccine if you have a specific risk factor for hepatitis A virus infection, or you simply wish to be protected from this disease. The vaccine is usually given as 2 doses, 6 to 18 months apart.  Hepatitis B. You need this vaccine if you have a specific risk factor for hepatitis B virus infection or you simply wish to be protected from this disease. The vaccine is given in 3 doses, usually over 6 months. Preventative Service / Frequency Ages 19 to 39  Blood pressure check.** / Every 1 to 2 years.  Lipid and cholesterol check.** / Every 5 years beginning at age 20.  Hepatitis C blood test.** / For any individual with known risks for hepatitis C.  Skin self-exam. / Monthly.  Influenza immunization.** / Every year.  Pneumococcal polysaccharide immunization.** / 1 to 2 doses if you smoke cigarettes or if you have certain chronic medical conditions.  Tetanus, diphtheria, pertussis (Tdap,Td) immunization. / A one-time dose of Tdap vaccine. After that, you need a Td booster dose every 10 years.  HPV immunization. / 3 doses over 6 months, if 26 and younger.  Measles, mumps, rubella (MMR) immunization. / You need at least 1 dose of MMR if you were born in 1957 or later. You may also need a 2nd dose.  Meningococcal immunization. / 1 dose if you are age 19 to 21 years and a first-year college student living in a residence hall, or have one of several medical conditions, you need to get vaccinated against meningococcal disease. You may also need additional booster doses.  Varicella immunization.** / Consult your caregiver.  Hepatitis A immunization.** / Consult your caregiver. 2 doses, 6 to 18 months apart.  Hepatitis B immunization.** / Consult your caregiver. 3 doses  usually over 6 months. Ages 40 to 64  Blood pressure check.** / Every 1 to 2 years.  Lipid and cholesterol check.** / Every 5 years beginning at age 20.  Fecal occult blood test (FOBT) of stool. / Every year beginning at age 50 and continuing until age 75. You may not have to do this test if you get colonoscopy every 10 years.  Flexible sigmoidoscopy** or colonoscopy.** / Every 5 years for a flexible sigmoidoscopy or every 10 years for a colonoscopy beginning at age 50 and continuing until age 75.  Hepatitis C blood test.** / For all people born from 1945 through 1965 and any   individual with known risks for hepatitis C.  Skin self-exam. / Monthly.  Influenza immunization.** / Every year.  Pneumococcal polysaccharide immunization.** / 1 to 2 doses if you smoke cigarettes or if you have certain chronic medical conditions.  Tetanus, diphtheria, pertussis (Tdap/Td) immunization.** / A one-time dose of Tdap vaccine. After that, you need a Td booster dose every 10 years.  Measles, mumps, rubella (MMR) immunization. / You need at least 1 dose of MMR if you were born in 1957 or later. You may also need a 2nd dose.  Varicella immunization.**/ Consult your caregiver.  Meningococcal immunization.** / Consult your caregiver.  Hepatitis A immunization.** / Consult your caregiver. 2 doses, 6 to 18 months apart.  Hepatitis B immunization.** / Consult your caregiver. 3 doses, usually over 6 months. Ages 65 and over  Blood pressure check.** / Every 1 to 2 years.  Lipid and cholesterol check.**/ Every 5 years beginning at age 20.  Fecal occult blood test (FOBT) of stool. / Every year beginning at age 50 and continuing until age 75. You may not have to do this test if you get colonoscopy every 10 years.  Flexible sigmoidoscopy** or colonoscopy.** / Every 5 years for a flexible sigmoidoscopy or every 10 years for a colonoscopy beginning at age 50 and continuing until age 75.  Hepatitis C blood  test.** / For all people born from 1945 through 1965 and any individual with known risks for hepatitis C.  Abdominal aortic aneurysm (AAA) screening.** / A one-time screening for ages 65 to 75 years who are current or former smokers.  Skin self-exam. / Monthly.  Influenza immunization.** / Every year.  Pneumococcal polysaccharide immunization.** / 1 dose at age 65 (or older) if you have never been vaccinated.  Tetanus, diphtheria, pertussis (Tdap, Td) immunization. / A one-time dose of Tdap vaccine if you are over 65 and have contact with an infant, are a healthcare worker, or simply want to be protected from whooping cough. After that, you need a Td booster dose every 10 years.  Varicella immunization. ** / Consult your caregiver.  Meningococcal immunization.** / Consult your caregiver.  Hepatitis A immunization. ** / Consult your caregiver. 2 doses, 6 to 18 months apart.  Hepatitis B immunization.** / Check with your caregiver. 3 doses, usually over 6 months. **Family history and personal history of risk and conditions may change your caregiver's recommendations. Document Released: 01/14/2002 Document Revised: 02/10/2012 Document Reviewed: 04/15/2011 ExitCare Patient Information 2013 [employer].  

## 2013-04-01 ENCOUNTER — Other Ambulatory Visit (INDEPENDENT_AMBULATORY_CARE_PROVIDER_SITE_OTHER): Payer: BC Managed Care – PPO

## 2013-04-01 DIAGNOSIS — Z0000 Encounter for general adult medical examination without abnormal findings: Secondary | ICD-10-CM

## 2013-04-01 LAB — LIPID PANEL
Cholesterol: 128 mg/dL (ref 0–200)
LDL Cholesterol: 75 mg/dL (ref 0–99)
Total CHOL/HDL Ratio: 3
Triglycerides: 71 mg/dL (ref 0.0–149.0)
VLDL: 14.2 mg/dL (ref 0.0–40.0)

## 2013-04-01 LAB — HEPATIC FUNCTION PANEL
Albumin: 4.2 g/dL (ref 3.5–5.2)
Alkaline Phosphatase: 75 U/L (ref 39–117)
Bilirubin, Direct: 0.1 mg/dL (ref 0.0–0.3)
Total Bilirubin: 1 mg/dL (ref 0.3–1.2)

## 2013-04-01 LAB — BASIC METABOLIC PANEL
Creatinine, Ser: 1 mg/dL (ref 0.4–1.5)
Glucose, Bld: 82 mg/dL (ref 70–99)
Potassium: 4.1 mEq/L (ref 3.5–5.1)
Sodium: 135 mEq/L (ref 135–145)

## 2013-04-01 LAB — TSH: TSH: 1.52 u[IU]/mL (ref 0.35–5.50)

## 2013-04-01 LAB — CBC WITH DIFFERENTIAL/PLATELET
Basophils Absolute: 0 10*3/uL (ref 0.0–0.1)
Eosinophils Relative: 9.1 % — ABNORMAL HIGH (ref 0.0–5.0)
HCT: 45 % (ref 39.0–52.0)
Lymphocytes Relative: 30.1 % (ref 12.0–46.0)
MCHC: 35.5 g/dL (ref 30.0–36.0)
Monocytes Relative: 9.3 % (ref 3.0–12.0)
Neutro Abs: 2.3 10*3/uL (ref 1.4–7.7)
RBC: 5.13 Mil/uL (ref 4.22–5.81)

## 2013-04-02 LAB — POCT URINALYSIS DIPSTICK
Bilirubin, UA: NEGATIVE
Blood, UA: NEGATIVE
Ketones, UA: NEGATIVE
Nitrite, UA: NEGATIVE
pH, UA: 6

## 2013-04-09 ENCOUNTER — Ambulatory Visit (INDEPENDENT_AMBULATORY_CARE_PROVIDER_SITE_OTHER): Payer: BC Managed Care – PPO | Admitting: Family Medicine

## 2013-04-09 ENCOUNTER — Encounter: Payer: Self-pay | Admitting: Family Medicine

## 2013-04-09 VITALS — BP 118/70 | HR 80 | Temp 98.1°F | Wt 217.2 lb

## 2013-04-09 DIAGNOSIS — R079 Chest pain, unspecified: Secondary | ICD-10-CM

## 2013-04-09 DIAGNOSIS — K219 Gastro-esophageal reflux disease without esophagitis: Secondary | ICD-10-CM

## 2013-04-09 LAB — BASIC METABOLIC PANEL
BUN: 15 mg/dL (ref 6–23)
CO2: 28 mEq/L (ref 19–32)
Calcium: 9.4 mg/dL (ref 8.4–10.5)
Creatinine, Ser: 1 mg/dL (ref 0.4–1.5)
GFR: 86.35 mL/min (ref >60.00)
Glucose, Bld: 84 mg/dL (ref 70–99)

## 2013-04-09 LAB — HEPATIC FUNCTION PANEL
ALT: 17 U/L (ref 0–53)
AST: 14 U/L (ref 0–37)
Alkaline Phosphatase: 92 U/L (ref 39–117)
Bilirubin, Direct: 0.2 mg/dL (ref 0.0–0.3)
Total Bilirubin: 1.9 mg/dL — ABNORMAL HIGH (ref 0.3–1.2)

## 2013-04-09 LAB — H. PYLORI ANTIBODY, IGG: H Pylori IgG: NEGATIVE

## 2013-04-09 MED ORDER — OMEPRAZOLE 40 MG PO CPDR: 40.0000 mg | DELAYED_RELEASE_CAPSULE | Freq: Every day | ORAL | Status: DC

## 2013-04-09 MED ORDER — GI COCKTAIL ~~LOC~~
30.0000 mL | Freq: Once | ORAL | Status: AC
  Administered 2013-04-09: 30 mL via ORAL

## 2013-04-09 NOTE — Patient Instructions (Addendum)
Diet for Gastroesophageal Reflux Disease, Adult  Reflux (acid reflux) is when acid from your stomach flows up into the esophagus. When acid comes in contact with the esophagus, the acid causes irritation and soreness (inflammation) in the esophagus. When reflux happens often or so severely that it causes damage to the esophagus, it is called gastroesophageal reflux disease (GERD). Nutrition therapy can help ease the discomfort of GERD.  FOODS OR DRINKS TO AVOID OR LIMIT   Smoking or chewing tobacco. Nicotine is one of the most potent stimulants to acid production in the gastrointestinal tract.   Caffeinated and decaffeinated coffee and black tea.   Regular or low-calorie carbonated beverages or energy drinks (caffeine-free carbonated beverages are allowed).    Strong spices, such as black pepper, white pepper, red pepper, cayenne, curry powder, and chili powder.   Peppermint or spearmint.   Chocolate.   High-fat foods, including meats and fried foods. Extra added fats including oils, butter, salad dressings, and nuts. Limit these to less than 8 tsp per day.   Fruits and vegetables if they are not tolerated, such as citrus fruits or tomatoes.   Alcohol.   Any food that seems to aggravate your condition.  If you have questions regarding your diet, call your caregiver or a registered dietitian.  OTHER THINGS THAT MAY HELP GERD INCLUDE:    Eating your meals slowly, in a relaxed setting.   Eating 5 to 6 small meals per day instead of 3 large meals.   Eliminating food for a period of time if it causes distress.   Not lying down until 3 hours after eating a meal.   Keeping the head of your bed raised 6 to 9 inches (15 to 23 cm) by using a foam wedge or blocks under the legs of the bed. Lying flat may make symptoms worse.   Being physically active. Weight loss may be helpful in reducing reflux in overweight or obese adults.   Wear loose fitting clothing  EXAMPLE MEAL PLAN  This meal plan is approximately  2,000 calories based on [employer]My[employer].gov meal planning guidelines.  Breakfast    cup cooked oatmeal.   1 cup strawberries.   1 cup low-fat milk.   1 oz almonds.  Snack   1 cup cucumber slices.   6 oz yogurt (made from low-fat or fat-free milk).  Lunch   2 slice whole-wheat bread.   2 oz sliced turkey.   2 tsp mayonnaise.   1 cup blueberries.   1 cup snap peas.  Snack   6 whole-wheat crackers.   1 oz string cheese.  Dinner    cup brown rice.   1 cup mixed veggies.   1 tsp olive oil.   3 oz grilled fish.  Document Released: 11/18/2005 Document Revised: 02/10/2012 Document Reviewed: 10/04/2011  Exit[employer] Patient Information 2013 [employer].

## 2013-04-09 NOTE — Progress Notes (Signed)
  Subjective:     Scott Sanchez is an 37 y.o. male who presents for evaluation of heartburn. This has been associated with belching, chest pain, heartburn and nocturnal burning. He denies abdominal bloating, choking on food, cough, deep pressure at base of neck, midespigastric pain and nausea. Symptoms have been present for 10 days. He has had dysphagia for solids. He has not lost weight. He denies melena, hematochezia, hematemesis, and coffee ground emesis. Medical therapy in the past has included: antacids.  The following portions of the patient's history were reviewed and updated as appropriate: allergies, current medications, past family history, past medical history, past social history, past surgical history and problem list.  Review of Systems Pertinent items are noted in HPI.   Objective:     BP 118/70  Pulse 80  Temp(Src) 98.1 F (36.7 C) (Oral)  Wt 217 lb 3.2 oz (98.521 kg)  BMI 31.63 kg/m2  SpO2 96% General appearance: alert, cooperative, appears stated age and no distress Neck: no adenopathy, supple, symmetrical, trachea midline and thyroid not enlarged, symmetric, no tenderness/mass/nodules Lungs: clear to auscultation bilaterally Heart: S1, S2 normal Abdomen: soft, non-tender; bowel sounds normal; no masses,  no organomegaly  EKG--NSR Assessment:    Gastroesophageal Reflux Disease,      Plan:    Nonpharmacologic treatments were discussed including: eating smaller meals, elevation of the head of bed at night, avoidance of caffeine, chocolate, nicotine and peppermint, and avoiding tight fitting clothing. Will start a trial of proton pump inhibitors.  Check labs If no relief-- refer GI

## 2013-04-09 NOTE — Addendum Note (Signed)
Addended by: Arnette Norris on: 04/09/2013 10:52 AM   Modules accepted: Orders

## 2013-09-28 ENCOUNTER — Ambulatory Visit (INDEPENDENT_AMBULATORY_CARE_PROVIDER_SITE_OTHER): Payer: BC Managed Care – PPO | Admitting: General Practice

## 2013-09-28 DIAGNOSIS — Z23 Encounter for immunization: Secondary | ICD-10-CM

## 2013-12-02 HISTORY — PX: KIDNEY STONE SURGERY: SHX686

## 2013-12-15 ENCOUNTER — Encounter: Payer: Self-pay | Admitting: Podiatry

## 2013-12-15 ENCOUNTER — Ambulatory Visit (INDEPENDENT_AMBULATORY_CARE_PROVIDER_SITE_OTHER): Payer: BC Managed Care – PPO | Admitting: Podiatry

## 2013-12-15 VITALS — BP 110/79 | HR 64 | Ht 70.0 in | Wt 220.0 lb

## 2013-12-15 DIAGNOSIS — M21969 Unspecified acquired deformity of unspecified lower leg: Secondary | ICD-10-CM

## 2013-12-15 DIAGNOSIS — M216X9 Other acquired deformities of unspecified foot: Secondary | ICD-10-CM | POA: Insufficient documentation

## 2013-12-15 DIAGNOSIS — M659 Synovitis and tenosynovitis, unspecified: Secondary | ICD-10-CM

## 2013-12-15 DIAGNOSIS — M25579 Pain in unspecified ankle and joints of unspecified foot: Secondary | ICD-10-CM

## 2013-12-15 DIAGNOSIS — M25571 Pain in right ankle and joints of right foot: Secondary | ICD-10-CM | POA: Insufficient documentation

## 2013-12-15 NOTE — Patient Instructions (Signed)
Seen for pain in right ankle.  Noted of tight Achilles tendon and weak first Metatarsal bone. May benefit from stretch exercise and Orthotic shoe inserts. Will prepare for orthotics on next visit.

## 2013-12-15 NOTE — Progress Notes (Signed)
38 year old male presents complaining of pain in right ankle. Patient points dorsolateral aspect of the right ankle being the source of pain.  Also he used to have shin splint after exercise.  Pain is usually after been resting for a while or in the morning.   Objective: Dermatologic: No abnormal skin lesions other than callus under the plantar medial aspect of the both great toes. Vascular: No pedal edema. All pulses are palpable.  Neurologic: All epicritic and tactile sensations grossly intact.  Orthopedic: Tight Achille tendon right, normal 15 degree dorsiflexion with knee extended on left.  Excess sagittal plane motion first ray bilateral.   Assessment: Tenosynovitis right lateral ankle. Ankle equinus right. Hypermobile first ray bilateral.   Plan: Reviewed clinical findings. Reviewed benefit of stretch exercise (instruction given), and orthotic shoe inserts.  Will prepare for orthotics next visit.

## 2013-12-21 ENCOUNTER — Ambulatory Visit: Payer: BC Managed Care – PPO | Admitting: Podiatry

## 2013-12-24 ENCOUNTER — Encounter: Payer: Self-pay | Admitting: Podiatry

## 2013-12-24 ENCOUNTER — Ambulatory Visit (INDEPENDENT_AMBULATORY_CARE_PROVIDER_SITE_OTHER): Payer: BC Managed Care – PPO | Admitting: Podiatry

## 2013-12-24 VITALS — BP 120/79 | HR 61 | Ht 70.0 in | Wt 220.0 lb

## 2013-12-24 DIAGNOSIS — M25571 Pain in right ankle and joints of right foot: Secondary | ICD-10-CM

## 2013-12-24 DIAGNOSIS — M21969 Unspecified acquired deformity of unspecified lower leg: Secondary | ICD-10-CM

## 2013-12-24 DIAGNOSIS — M216X9 Other acquired deformities of unspecified foot: Secondary | ICD-10-CM

## 2013-12-24 DIAGNOSIS — M659 Synovitis and tenosynovitis, unspecified: Secondary | ICD-10-CM

## 2013-12-24 DIAGNOSIS — M65979 Unspecified synovitis and tenosynovitis, unspecified ankle and foot: Secondary | ICD-10-CM

## 2013-12-24 DIAGNOSIS — M25579 Pain in unspecified ankle and joints of unspecified foot: Secondary | ICD-10-CM

## 2013-12-24 NOTE — Progress Notes (Signed)
Came in to have Orthotics prepared.  Objective: Tight Achilles tendon right. Elevated first ray bilateral. Hypermobile first ray bilateral. Neurovascular status are within normal. Plantar callus under both great toe. Both feet X-rays taken and reviewed findings, Cavus type foot with elevated first ray in significant degree bilateral in lateral view. AP view show no significant osseous/articular change.  Assessment: Tenosynovitis right ankle secondary to faulty biomechanics from abnormal shifting of the first ray; significant sagittal displacement and compensatory STJ hyperpronation. Ankle Equinus right. Dorsal displacement of the first ray bilateral.   Plan: Reviewed clinical and X-ray findings.  Both feet casted for Orthotics.

## 2013-12-24 NOTE — Patient Instructions (Signed)
Casted for Orthotics. Reviewed findings.

## 2014-02-11 ENCOUNTER — Ambulatory Visit (INDEPENDENT_AMBULATORY_CARE_PROVIDER_SITE_OTHER): Payer: BC Managed Care – PPO | Admitting: Podiatry

## 2014-02-11 ENCOUNTER — Encounter: Payer: Self-pay | Admitting: Podiatry

## 2014-02-11 VITALS — BP 121/78 | HR 64

## 2014-02-11 DIAGNOSIS — M65979 Unspecified synovitis and tenosynovitis, unspecified ankle and foot: Secondary | ICD-10-CM

## 2014-02-11 DIAGNOSIS — M659 Synovitis and tenosynovitis, unspecified: Secondary | ICD-10-CM

## 2014-02-11 NOTE — Patient Instructions (Signed)
Doing well with orthotics. Still need to stretch right Achilles tendon on right.  Continue with Orthotics. Return in 2-3 months or as needed.

## 2014-02-11 NOTE — Progress Notes (Signed)
Still some discomfort on right ankle. But not as much as used to be. Still having pain in left thigh at lateral aspect, which caused him to come in and got the Orthotics.  Orthotics are helping quiet a bit.   Assessment: Doing well with orthotics. Right side tendon is still tight.  Plan: Still need to stretch right Achilles tendon on right.  Continue with Orthotics. Return in 2-3 months or as needed.

## 2014-09-06 ENCOUNTER — Ambulatory Visit (INDEPENDENT_AMBULATORY_CARE_PROVIDER_SITE_OTHER): Payer: BC Managed Care – PPO

## 2014-09-06 DIAGNOSIS — Z23 Encounter for immunization: Secondary | ICD-10-CM

## 2015-06-06 ENCOUNTER — Telehealth: Payer: Self-pay | Admitting: Family Medicine

## 2015-06-06 NOTE — Telephone Encounter (Signed)
pre visit letter mailed 06/02/15 °

## 2015-06-22 ENCOUNTER — Telehealth: Payer: Self-pay | Admitting: *Deleted

## 2015-06-22 NOTE — Telephone Encounter (Signed)
Unable to reach patient at time of Pre-Visit Call.  Left message for patient to return call when available.    

## 2015-06-23 ENCOUNTER — Encounter: Payer: Self-pay | Admitting: Family Medicine

## 2015-06-23 ENCOUNTER — Ambulatory Visit (INDEPENDENT_AMBULATORY_CARE_PROVIDER_SITE_OTHER): Payer: BLUE CROSS/BLUE SHIELD | Admitting: Family Medicine

## 2015-06-23 VITALS — BP 115/78 | HR 75 | Temp 98.3°F | Ht 70.0 in | Wt 204.4 lb

## 2015-06-23 DIAGNOSIS — Z0000 Encounter for general adult medical examination without abnormal findings: Secondary | ICD-10-CM

## 2015-06-23 DIAGNOSIS — Z87442 Personal history of urinary calculi: Secondary | ICD-10-CM | POA: Diagnosis not present

## 2015-06-23 NOTE — Progress Notes (Signed)
Patient ID: Scott Sanchez, male    DOB: May 20, 1976  Age: 39 y.o. MRN: [id number]    Subjective:  Subjective HPI Scott Sanchez presents for cpe and labs.  He is not fasting today.   No complaints.   Review of Systems  Constitutional: Negative.   HENT: Negative for congestion, ear pain, hearing loss, nosebleeds, postnasal drip, rhinorrhea, sinus pressure, sneezing and tinnitus.   Eyes: Negative for photophobia, discharge, itching and visual disturbance.  Respiratory: Negative.   Cardiovascular: Negative.   Gastrointestinal: Negative for abdominal pain, constipation, blood in stool, abdominal distention and anal bleeding.  Endocrine: Negative.   Genitourinary: Negative.   Musculoskeletal: Negative.   Skin: Negative.   Allergic/Immunologic: Negative.   Neurological: Negative for dizziness, weakness, light-headedness, numbness and headaches.  Psychiatric/Behavioral: Negative for suicidal ideas, confusion, sleep disturbance, dysphoric mood, decreased concentration and agitation. The patient is not nervous/anxious.     History No past medical history on file.  He has past surgical history that includes Kidney stone surgery (y-3).   His family history is not on file.He reports that he has never smoked. He has never used smokeless tobacco. He reports that he does not drink alcohol or use illicit drugs.  No current outpatient prescriptions on file prior to visit.   No current facility-administered medications on file prior to visit.     Objective:  Objective Physical Exam  Constitutional: He is oriented to person, place, and time. He appears well-developed and well-nourished. No distress.  HENT:  Head: Normocephalic and atraumatic.  Right Ear: External ear normal.  Left Ear: External ear normal.  Nose: Nose normal.  Mouth/Throat: Oropharynx is clear and moist. No oropharyngeal exudate.  Eyes: Conjunctivae and EOM are normal. Pupils are equal, round, and reactive to light.  Right eye exhibits no discharge. Left eye exhibits no discharge.  Neck: Normal range of motion. Neck supple. No JVD present. No thyromegaly present.  Cardiovascular: Normal rate, regular rhythm and intact distal pulses.  Exam reveals no gallop and no friction rub.   No murmur heard. Pulmonary/Chest: Effort normal and breath sounds normal. No respiratory distress. He has no wheezes. He has no rales. He exhibits no tenderness.  Abdominal: Soft. Bowel sounds are normal. He exhibits no distension and no mass. There is no tenderness. There is no rebound and no guarding.  Genitourinary: Rectum normal, prostate normal and penis normal. Guaiac negative stool.  Musculoskeletal: Normal range of motion. He exhibits no edema or tenderness.  Lymphadenopathy:    He has no cervical adenopathy.  Neurological: He is alert and oriented to person, place, and time. He displays normal reflexes. He exhibits normal muscle tone.  Skin: Skin is warm and dry. No rash noted. He is not diaphoretic. No erythema. No pallor.  Psychiatric: He has a normal mood and affect. His behavior is normal. Judgment and thought content normal.   BP 115/78 mmHg  Pulse 75  Temp(Src) 98.3 F (36.8 C) (Oral)  Ht  (1.778 m)  Wt 204 lb 6.4 oz (92.715 kg)  BMI 29.33 kg/m2  SpO2 96% Wt Readings from Last 3 Encounters:  06/23/15 204 lb 6.4 oz (92.715 kg)  12/24/13 220 lb (99.791 kg)  12/15/13 220 lb (99.791 kg)     Lab Results  Component Value Date   WBC 4.5 04/01/2013   HGB 16.0 04/01/2013   HCT 45.0 04/01/2013   PLT 200.0 04/01/2013   GLUCOSE 84 04/09/2013   CHOL 128 04/01/2013   TRIG 71.0 04/01/2013  HDL 38.80* 04/01/2013   LDLCALC 75 04/01/2013   ALT 17 04/09/2013   AST 14 04/09/2013   NA 138 04/09/2013   K 4.1 04/09/2013   CL 103 04/09/2013   CREATININE 1.0 04/09/2013   BUN 15 04/09/2013   CO2 28 04/09/2013   TSH 1.52 04/01/2013    No results found.   Assessment & Plan:  Plan I have discontinued Mr.  Gelles ranitidine and omeprazole.  No orders of the defined types were placed in this encounter.    Problem List Items Addressed This Visit    Preventative health care - Primary    Check labs See AVS ghm utd      Relevant Orders   Basic metabolic panel   CBC with Differential/Platelet   Hepatic function panel   Lipid panel   Microalbumin / creatinine urine ratio   POCT urinalysis dipstick   TSH   PSA   HIV antibody    Other Visit Diagnoses    History of kidney stones        Relevant Orders    POCT urinalysis dipstick       Follow-up: Return in about 1 year (around 06/22/2016), or if symptoms worsen or fail to improve, for annual exam, fasting.  Loreen Freud, DO

## 2015-06-23 NOTE — Progress Notes (Signed)
Pre visit review using our clinic review tool, if applicable. No additional management support is needed unless otherwise documented below in the visit note. 

## 2015-06-23 NOTE — Assessment & Plan Note (Signed)
Check labs See AVS ghm utd  

## 2015-06-23 NOTE — Patient Instructions (Signed)
Preventive Care for Adults A healthy lifestyle and preventive care can promote health and wellness. Preventive health guidelines for men include the following key practices:  A routine yearly physical is a good way to check with your health care provider about your health and preventative screening. It is a chance to share any concerns and updates on your health and to receive a thorough exam.  Visit your dentist for a routine exam and preventative care every 6 months. Brush your teeth twice a day and floss once a day. Good oral hygiene prevents tooth decay and gum disease.  The frequency of eye exams is based on your age, health, family medical history, use of contact lenses, and other factors. Follow your health care provider's recommendations for frequency of eye exams.  Eat a healthy diet. Foods such as vegetables, fruits, whole grains, low-fat dairy products, and lean protein foods contain the nutrients you need without too many calories. Decrease your intake of foods high in solid fats, added sugars, and salt. Eat the right amount of calories for you.Get information about a proper diet from your health care provider, if necessary.  Regular physical exercise is one of the most important things you can do for your health. Most adults should get at least 150 minutes of moderate-intensity exercise (any activity that increases your heart rate and causes you to sweat) each week. In addition, most adults need muscle-strengthening exercises on 2 or more days a week.  Maintain a healthy weight. The body mass index (BMI) is a screening tool to identify possible weight problems. It provides an estimate of body fat based on height and weight. Your health care provider can find your BMI and can help you achieve or maintain a healthy weight.For adults 20 years and older:  A BMI below 18.5 is considered underweight.  A BMI of 18.5 to 24.9 is normal.  A BMI of 25 to 29.9 is considered overweight.  A BMI  of 30 and above is considered obese.  Maintain normal blood lipids and cholesterol levels by exercising and minimizing your intake of saturated fat. Eat a balanced diet with plenty of fruit and vegetables. Blood tests for lipids and cholesterol should begin at age 50 and be repeated every 5 years. If your lipid or cholesterol levels are high, you are over 50, or you are at high risk for heart disease, you may need your cholesterol levels checked more frequently.Ongoing high lipid and cholesterol levels should be treated with medicines if diet and exercise are not working.  If you smoke, find out from your health care provider how to quit. If you do not use tobacco, do not start.  Lung cancer screening is recommended for adults aged 73-80 years who are at high risk for developing lung cancer because of a history of smoking. A yearly low-dose CT scan of the lungs is recommended for people who have at least a 30-pack-year history of smoking and are a current smoker or have quit within the past 15 years. A pack year of smoking is smoking an average of 1 pack of cigarettes a day for 1 year (for example: 1 pack a day for 30 years or 2 packs a day for 15 years). Yearly screening should continue until the smoker has stopped smoking for at least 15 years. Yearly screening should be stopped for people who develop a health problem that would prevent them from having lung cancer treatment.  If you choose to drink alcohol, do not have more than  2 drinks per day. One drink is considered to be 12 ounces (355 mL) of beer, 5 ounces (148 mL) of wine, or 1.5 ounces (44 mL) of liquor.  Avoid use of street drugs. Do not share needles with anyone. Ask for help if you need support or instructions about stopping the use of drugs.  High blood pressure causes heart disease and increases the risk of stroke. Your blood pressure should be checked at least every 1-2 years. Ongoing high blood pressure should be treated with  medicines, if weight loss and exercise are not effective.  If you are 45-79 years old, ask your health care provider if you should take aspirin to prevent heart disease.  Diabetes screening involves taking a blood sample to check your fasting blood sugar level. This should be done once every 3 years, after age 45, if you are within normal weight and without risk factors for diabetes. Testing should be considered at a younger age or be carried out more frequently if you are overweight and have at least 1 risk factor for diabetes.  Colorectal cancer can be detected and often prevented. Most routine colorectal cancer screening begins at the age of 50 and continues through age 75. However, your health care provider may recommend screening at an earlier age if you have risk factors for colon cancer. On a yearly basis, your health care provider may provide home test kits to check for hidden blood in the stool. Use of a small camera at the end of a tube to directly examine the colon (sigmoidoscopy or colonoscopy) can detect the earliest forms of colorectal cancer. Talk to your health care provider about this at age 50, when routine screening begins. Direct exam of the colon should be repeated every 5-10 years through age 75, unless early forms of precancerous polyps or small growths are found.  People who are at an increased risk for hepatitis B should be screened for this virus. You are considered at high risk for hepatitis B if:  You were born in a country where hepatitis B occurs often. Talk with your health care provider about which countries are considered high risk.  Your parents were born in a high-risk country and you have not received a shot to protect against hepatitis B (hepatitis B vaccine).  You have HIV or AIDS.  You use needles to inject street drugs.  You live with, or have sex with, someone who has hepatitis B.  You are a man who has sex with other men (MSM).  You get hemodialysis  treatment.  You take certain medicines for conditions such as cancer, organ transplantation, and autoimmune conditions.  Hepatitis C blood testing is recommended for all people born from 1945 through 1965 and any individual with known risks for hepatitis C.  Practice safe sex. Use condoms and avoid high-risk sexual practices to reduce the spread of sexually transmitted infections (STIs). STIs include gonorrhea, chlamydia, syphilis, trichomonas, herpes, HPV, and human immunodeficiency virus (HIV). Herpes, HIV, and HPV are viral illnesses that have no cure. They can result in disability, cancer, and death.  If you are at risk of being infected with HIV, it is recommended that you take a prescription medicine daily to prevent HIV infection. This is called preexposure prophylaxis (PrEP). You are considered at risk if:  You are a man who has sex with other men (MSM) and have other risk factors.  You are a heterosexual man, are sexually active, and are at increased risk for HIV infection.    You take drugs by injection.  You are sexually active with a partner who has HIV.  Talk with your health care provider about whether you are at high risk of being infected with HIV. If you choose to begin PrEP, you should first be tested for HIV. You should then be tested every 3 months for as long as you are taking PrEP.  A one-time screening for abdominal aortic aneurysm (AAA) and surgical repair of large AAAs by ultrasound are recommended for men ages 32 to 67 years who are current or former smokers.  Healthy men should no longer receive prostate-specific antigen (PSA) blood tests as part of routine cancer screening. Talk with your health care provider about prostate cancer screening.  Testicular cancer screening is not recommended for adult males who have no symptoms. Screening includes self-exam, a health care provider exam, and other screening tests. Consult with your health care provider about any symptoms  you have or any concerns you have about testicular cancer.  Use sunscreen. Apply sunscreen liberally and repeatedly throughout the day. You should seek shade when your shadow is shorter than you. Protect yourself by wearing long sleeves, pants, a wide-brimmed hat, and sunglasses year round, whenever you are outdoors.  Once a month, do a whole-body skin exam, using a mirror to look at the skin on your back. Tell your health care provider about new moles, moles that have irregular borders, moles that are larger than a pencil eraser, or moles that have changed in shape or color.  Stay current with required vaccines (immunizations).  Influenza vaccine. All adults should be immunized every year.  Tetanus, diphtheria, and acellular pertussis (Td, Tdap) vaccine. An adult who has not previously received Tdap or who does not know his vaccine status should receive 1 dose of Tdap. This initial dose should be followed by tetanus and diphtheria toxoids (Td) booster doses every 10 years. Adults with an unknown or incomplete history of completing a 3-dose immunization series with Td-containing vaccines should begin or complete a primary immunization series including a Tdap dose. Adults should receive a Td booster every 10 years.  Varicella vaccine. An adult without evidence of immunity to varicella should receive 2 doses or a second dose if he has previously received 1 dose.  Human papillomavirus (HPV) vaccine. Males aged 68-21 years who have not received the vaccine previously should receive the 3-dose series. Males aged 22-26 years may be immunized. Immunization is recommended through the age of 6 years for any male who has sex with males and did not get any or all doses earlier. Immunization is recommended for any person with an immunocompromised condition through the age of 49 years if he did not get any or all doses earlier. During the 3-dose series, the second dose should be obtained 4-8 weeks after the first  dose. The third dose should be obtained 24 weeks after the first dose and 16 weeks after the second dose.  Zoster vaccine. One dose is recommended for adults aged 50 years or older unless certain conditions are present.  Measles, mumps, and rubella (MMR) vaccine. Adults born before [birth year] generally are considered immune to measles and mumps. Adults born in [birth year] or later should have 1 or more doses of MMR vaccine unless there is a contraindication to the vaccine or there is laboratory evidence of immunity to each of the three diseases. A routine second dose of MMR vaccine should be obtained at least 28 days after the first dose for students attending postsecondary  schools, health care workers, or international travelers. People who received inactivated measles vaccine or an unknown type of measles vaccine during 1963-1967 should receive 2 doses of MMR vaccine. People who received inactivated mumps vaccine or an unknown type of mumps vaccine before 1979 and are at high risk for mumps infection should consider immunization with 2 doses of MMR vaccine. Unvaccinated health care workers born before 1957 who lack laboratory evidence of measles, mumps, or rubella immunity or laboratory confirmation of disease should consider measles and mumps immunization with 2 doses of MMR vaccine or rubella immunization with 1 dose of MMR vaccine.  Pneumococcal 13-valent conjugate (PCV13) vaccine. When indicated, a person who is uncertain of his immunization history and has no record of immunization should receive the PCV13 vaccine. An adult aged 19 years or older who has certain medical conditions and has not been previously immunized should receive 1 dose of PCV13 vaccine. This PCV13 should be followed with a dose of pneumococcal polysaccharide (PPSV23) vaccine. The PPSV23 vaccine dose should be obtained at least 8 weeks after the dose of PCV13 vaccine. An adult aged 19 years or older who has certain medical conditions and  previously received 1 or more doses of PPSV23 vaccine should receive 1 dose of PCV13. The PCV13 vaccine dose should be obtained 1 or more years after the last PPSV23 vaccine dose.  Pneumococcal polysaccharide (PPSV23) vaccine. When PCV13 is also indicated, PCV13 should be obtained first. All adults aged 65 years and older should be immunized. An adult younger than age 65 years who has certain medical conditions should be immunized. Any person who resides in a nursing home or long-term care facility should be immunized. An adult smoker should be immunized. People with an immunocompromised condition and certain other conditions should receive both PCV13 and PPSV23 vaccines. People with human immunodeficiency virus (HIV) infection should be immunized as soon as possible after diagnosis. Immunization during chemotherapy or radiation therapy should be avoided. Routine use of PPSV23 vaccine is not recommended for American Indians, [place] Natives, or people younger than 65 years unless there are medical conditions that require PPSV23 vaccine. When indicated, people who have unknown immunization and have no record of immunization should receive PPSV23 vaccine. One-time revaccination 5 years after the first dose of PPSV23 is recommended for people aged 19-64 years who have chronic kidney failure, nephrotic syndrome, asplenia, or immunocompromised conditions. People who received 1-2 doses of PPSV23 before age 65 years should receive another dose of PPSV23 vaccine at age 65 years or later if at least 5 years have passed since the previous dose. Doses of PPSV23 are not needed for people immunized with PPSV23 at or after age 65 years.  Meningococcal vaccine. Adults with asplenia or persistent complement component deficiencies should receive 2 doses of quadrivalent meningococcal conjugate (MenACWY-D) vaccine. The doses should be obtained at least 2 months apart. Microbiologists working with certain meningococcal bacteria,  military recruits, people at risk during an outbreak, and people who travel to or live in countries with a high rate of meningitis should be immunized. A first-year college student up through age 21 years who is living in a residence hall should receive a dose if he did not receive a dose on or after his 16th birthday. Adults who have certain high-risk conditions should receive one or more doses of vaccine.  Hepatitis A vaccine. Adults who wish to be protected from this disease, have certain high-risk conditions, work with hepatitis A-infected animals, work in hepatitis A research labs, or   travel to or work in countries with a high rate of hepatitis A should be immunized. Adults who were previously unvaccinated and who anticipate close contact with an international adoptee during the first 60 days after arrival in the [place] from a country with a high rate of hepatitis A should be immunized.  Hepatitis B vaccine. Adults should be immunized if they wish to be protected from this disease, have certain high-risk conditions, may be exposed to blood or other infectious body fluids, are household contacts or sex partners of hepatitis B positive people, are clients or workers in certain care facilities, or travel to or work in countries with a high rate of hepatitis B.  Haemophilus influenzae type b (Hib) vaccine. A previously unvaccinated person with asplenia or sickle cell disease or having a scheduled splenectomy should receive 1 dose of Hib vaccine. Regardless of previous immunization, a recipient of a hematopoietic stem cell transplant should receive a 3-dose series 6-12 months after his successful transplant. Hib vaccine is not recommended for adults with HIV infection. Preventive Service / Frequency Ages 52 to 17  Blood pressure check.** / Every 1 to 2 years.  Lipid and cholesterol check.** / Every 5 years beginning at age 69.  Hepatitis C blood test.** / For any individual with known risks for  hepatitis C.  Skin self-exam. / Monthly.  Influenza vaccine. / Every year.  Tetanus, diphtheria, and acellular pertussis (Tdap, Td) vaccine.** / Consult your health care provider. 1 dose of Td every 10 years.  Varicella vaccine.** / Consult your health care provider.  HPV vaccine. / 3 doses over 6 months, if 72 or younger.  Measles, mumps, rubella (MMR) vaccine.** / You need at least 1 dose of MMR if you were born in 1957 or later. You may also need a second dose.  Pneumococcal 13-valent conjugate (PCV13) vaccine.** / Consult your health care provider.  Pneumococcal polysaccharide (PPSV23) vaccine.** / 1 to 2 doses if you smoke cigarettes or if you have certain conditions.  Meningococcal vaccine.** / 1 dose if you are age 35 to 60 years and a Market researcher living in a residence hall, or have one of several medical conditions. You may also need additional booster doses.  Hepatitis A vaccine.** / Consult your health care provider.  Hepatitis B vaccine.** / Consult your health care provider.  Haemophilus influenzae type b (Hib) vaccine.** / Consult your health care provider. Ages 35 to 8  Blood pressure check.** / Every 1 to 2 years.  Lipid and cholesterol check.** / Every 5 years beginning at age 57.  Lung cancer screening. / Every year if you are aged 44-80 years and have a 30-pack-year history of smoking and currently smoke or have quit within the past 15 years. Yearly screening is stopped once you have quit smoking for at least 15 years or develop a health problem that would prevent you from having lung cancer treatment.  Fecal occult blood test (FOBT) of stool. / Every year beginning at age 55 and continuing until age 73. You may not have to do this test if you get a colonoscopy every 10 years.  Flexible sigmoidoscopy** or colonoscopy.** / Every 5 years for a flexible sigmoidoscopy or every 10 years for a colonoscopy beginning at age 28 and continuing until age  1.  Hepatitis C blood test.** / For all people born from [birth year] through 1965 and any individual with known risks for hepatitis C.  Skin self-exam. / Monthly.  Influenza vaccine. / Every  year.  Tetanus, diphtheria, and acellular pertussis (Tdap/Td) vaccine.** / Consult your health care provider. 1 dose of Td every 10 years.  Varicella vaccine.** / Consult your health care provider.  Zoster vaccine.** / 1 dose for adults aged 53 years or older.  Measles, mumps, rubella (MMR) vaccine.** / You need at least 1 dose of MMR if you were born in 1957 or later. You may also need a second dose.  Pneumococcal 13-valent conjugate (PCV13) vaccine.** / Consult your health care provider.  Pneumococcal polysaccharide (PPSV23) vaccine.** / 1 to 2 doses if you smoke cigarettes or if you have certain conditions.  Meningococcal vaccine.** / Consult your health care provider.  Hepatitis A vaccine.** / Consult your health care provider.  Hepatitis B vaccine.** / Consult your health care provider.  Haemophilus influenzae type b (Hib) vaccine.** / Consult your health care provider. Ages 77 and over  Blood pressure check.** / Every 1 to 2 years.  Lipid and cholesterol check.**/ Every 5 years beginning at age 85.  Lung cancer screening. / Every year if you are aged 55-80 years and have a 30-pack-year history of smoking and currently smoke or have quit within the past 15 years. Yearly screening is stopped once you have quit smoking for at least 15 years or develop a health problem that would prevent you from having lung cancer treatment.  Fecal occult blood test (FOBT) of stool. / Every year beginning at age 33 and continuing until age 11. You may not have to do this test if you get a colonoscopy every 10 years.  Flexible sigmoidoscopy** or colonoscopy.** / Every 5 years for a flexible sigmoidoscopy or every 10 years for a colonoscopy beginning at age 28 and continuing until age 73.  Hepatitis C blood  test.** / For all people born from [birth year] through 1965 and any individual with known risks for hepatitis C.  Abdominal aortic aneurysm (AAA) screening.** / A one-time screening for ages 50 to 27 years who are current or former smokers.  Skin self-exam. / Monthly.  Influenza vaccine. / Every year.  Tetanus, diphtheria, and acellular pertussis (Tdap/Td) vaccine.** / 1 dose of Td every 10 years.  Varicella vaccine.** / Consult your health care provider.  Zoster vaccine.** / 1 dose for adults aged 34 years or older.  Pneumococcal 13-valent conjugate (PCV13) vaccine.** / Consult your health care provider.  Pneumococcal polysaccharide (PPSV23) vaccine.** / 1 dose for all adults aged 63 years and older.  Meningococcal vaccine.** / Consult your health care provider.  Hepatitis A vaccine.** / Consult your health care provider.  Hepatitis B vaccine.** / Consult your health care provider.  Haemophilus influenzae type b (Hib) vaccine.** / Consult your health care provider. **Family history and personal history of risk and conditions may change your health care provider's recommendations. Document Released: 01/14/2002 Document Revised: 11/23/2013 Document Reviewed: 04/15/2011 [facility] Patient Information 2015 [place], [place]. This information is not intended to replace advice given to you by your health care provider. Make sure you discuss any questions you have with your health care provider.

## 2015-06-26 ENCOUNTER — Other Ambulatory Visit (INDEPENDENT_AMBULATORY_CARE_PROVIDER_SITE_OTHER): Payer: BLUE CROSS/BLUE SHIELD

## 2015-06-26 ENCOUNTER — Other Ambulatory Visit: Payer: BLUE CROSS/BLUE SHIELD

## 2015-06-26 DIAGNOSIS — Z0000 Encounter for general adult medical examination without abnormal findings: Secondary | ICD-10-CM | POA: Diagnosis not present

## 2015-06-26 DIAGNOSIS — Z87442 Personal history of urinary calculi: Secondary | ICD-10-CM

## 2015-06-26 LAB — LIPID PANEL
CHOLESTEROL: 143 mg/dL (ref 0–200)
HDL: 38.6 mg/dL — ABNORMAL LOW (ref >39.00)
LDL Cholesterol: 82 mg/dL (ref 0–99)
NONHDL: 104.4
TRIGLYCERIDES: 112 mg/dL (ref 0.0–149.0)
Total CHOL/HDL Ratio: 4
VLDL: 22.4 mg/dL (ref 0.0–40.0)

## 2015-06-26 LAB — HEPATIC FUNCTION PANEL
ALBUMIN: 4.4 g/dL (ref 3.5–5.2)
ALT: 18 U/L (ref 0–53)
AST: 13 U/L (ref 0–37)
Alkaline Phosphatase: 81 U/L (ref 39–117)
Bilirubin, Direct: 0.2 mg/dL (ref 0.0–0.3)
TOTAL PROTEIN: 6.4 g/dL (ref 6.0–8.3)
Total Bilirubin: 1 mg/dL (ref 0.2–1.2)

## 2015-06-26 LAB — BASIC METABOLIC PANEL
BUN: 15 mg/dL (ref 6–23)
CO2: 26 meq/L (ref 19–32)
Calcium: 9.4 mg/dL (ref 8.4–10.5)
Chloride: 104 mEq/L (ref 96–112)
Creatinine, Ser: 0.96 mg/dL (ref 0.40–1.50)
GFR: 91.58 mL/min (ref >60.00)
Glucose, Bld: 78 mg/dL (ref 70–99)
Potassium: 4 mEq/L (ref 3.5–5.1)
SODIUM: 139 meq/L (ref 135–145)

## 2015-06-26 LAB — CBC WITH DIFFERENTIAL/PLATELET
BASOS ABS: 0 10*3/uL (ref 0.0–0.1)
Basophils Relative: 0.7 % (ref 0.0–3.0)
EOS ABS: 0.4 10*3/uL (ref 0.0–0.7)
EOS PCT: 9.5 % — AB (ref 0.0–5.0)
HEMATOCRIT: 48 % (ref 39.0–52.0)
Hemoglobin: 16.6 g/dL (ref 13.0–17.0)
LYMPHS ABS: 1.5 10*3/uL (ref 0.7–4.0)
Lymphocytes Relative: 32.4 % (ref 12.0–46.0)
MCHC: 34.6 g/dL (ref 30.0–36.0)
MCV: 88.6 fl (ref 78.0–100.0)
MONOS PCT: 9.3 % (ref 3.0–12.0)
Monocytes Absolute: 0.4 10*3/uL (ref 0.1–1.0)
Neutro Abs: 2.2 10*3/uL (ref 1.4–7.7)
Neutrophils Relative %: 48.1 % (ref 43.0–77.0)
Platelets: 200 10*3/uL (ref 150.0–400.0)
RBC: 5.42 Mil/uL (ref 4.22–5.81)
RDW: 12.7 % (ref 11.5–15.5)
WBC: 4.5 10*3/uL (ref 4.0–10.5)

## 2015-06-26 LAB — MICROALBUMIN / CREATININE URINE RATIO
Creatinine,U: 198.2 mg/dL
MICROALB UR: 2.1 mg/dL — AB (ref 0.0–1.9)
Microalb Creat Ratio: 1.1 mg/g (ref 0.0–30.0)

## 2015-06-26 LAB — PSA: PSA: 0.3 ng/mL (ref 0.10–4.00)

## 2015-06-26 LAB — TSH: TSH: 3.98 u[IU]/mL (ref 0.35–4.50)

## 2015-06-27 LAB — HIV ANTIBODY (ROUTINE TESTING W REFLEX): HIV 1&2 Ab, 4th Generation: NONREACTIVE

## 2015-08-30 ENCOUNTER — Ambulatory Visit (INDEPENDENT_AMBULATORY_CARE_PROVIDER_SITE_OTHER): Payer: BLUE CROSS/BLUE SHIELD

## 2015-08-30 DIAGNOSIS — Z23 Encounter for immunization: Secondary | ICD-10-CM | POA: Diagnosis not present

## 2016-07-04 ENCOUNTER — Ambulatory Visit (INDEPENDENT_AMBULATORY_CARE_PROVIDER_SITE_OTHER): Payer: 59 | Admitting: Family Medicine

## 2016-07-04 ENCOUNTER — Encounter: Payer: Self-pay | Admitting: Family Medicine

## 2016-07-04 VITALS — BP 110/80 | HR 80 | Temp 98.1°F | Ht 70.0 in | Wt 234.6 lb

## 2016-07-04 DIAGNOSIS — Z0000 Encounter for general adult medical examination without abnormal findings: Secondary | ICD-10-CM

## 2016-07-04 NOTE — Patient Instructions (Signed)

## 2016-07-04 NOTE — Progress Notes (Signed)
Pre visit review using our clinic review tool, if applicable. No additional management support is needed unless otherwise documented below in the visit note. 

## 2016-07-04 NOTE — Progress Notes (Signed)
Patient ID: Scott Sanchez, male    DOB: 1976/04/29  Age: 40 y.o. MRN: [id number]    Subjective:  Subjective  HPI Scott Sanchez presents for cpe and labs .   No complaints.    Review of Systems  Constitutional: Negative for fatigue and unexpected weight change.  HENT: Negative for congestion, ear pain, hearing loss, nosebleeds, postnasal drip, rhinorrhea, sinus pressure, sneezing and tinnitus.   Eyes: Negative for photophobia, discharge, itching and visual disturbance.  Respiratory: Negative.  Negative for cough and shortness of breath.   Cardiovascular: Negative.  Negative for chest pain and palpitations.  Gastrointestinal: Negative for abdominal distention, abdominal pain, anal bleeding, blood in stool and constipation.  Endocrine: Negative.   Genitourinary: Negative.   Musculoskeletal: Negative.   Skin: Negative.   Allergic/Immunologic: Negative.   Neurological: Negative for dizziness, weakness, light-headedness, numbness and headaches.  Psychiatric/Behavioral: Negative for agitation, confusion, decreased concentration, dysphoric mood, sleep disturbance and suicidal ideas. The patient is not nervous/anxious.     History History reviewed. No pertinent past medical history.  He has a past surgical history that includes Kidney stone surgery (y-3).   His family history includes Diabetes Mellitus II in his father; Hyperlipidemia in his father.He reports that he has never smoked. He has never used smokeless tobacco. He reports that he does not drink alcohol or use drugs.  No current outpatient prescriptions on file prior to visit.   No current facility-administered medications on file prior to visit.      Objective:  Objective  Physical Exam  Constitutional: He is oriented to person, place, and time. He appears well-developed and well-nourished. No distress.  HENT:  Head: Normocephalic and atraumatic.  Right Ear: External ear normal.  Left Ear: External ear normal.  Nose:  Nose normal.  Mouth/Throat: Oropharynx is clear and moist. No oropharyngeal exudate.  Eyes: Conjunctivae and EOM are normal. Pupils are equal, round, and reactive to light. Right eye exhibits no discharge. Left eye exhibits no discharge.  Neck: Normal range of motion. Neck supple. No JVD present. No thyromegaly present.  Cardiovascular: Normal rate, regular rhythm and intact distal pulses.  Exam reveals no gallop and no friction rub.   No murmur heard. Pulmonary/Chest: Effort normal and breath sounds normal. No respiratory distress. He has no wheezes. He has no rales. He exhibits no tenderness.  Abdominal: Soft. Bowel sounds are normal. He exhibits no distension and no mass. There is no tenderness. There is no rebound and no guarding.  Genitourinary: Rectum normal, prostate normal and penis normal. Rectal exam shows guaiac negative stool.  Musculoskeletal: Normal range of motion. He exhibits no edema or tenderness.  Lymphadenopathy:    He has no cervical adenopathy.  Neurological: He is alert and oriented to person, place, and time. He displays normal reflexes. He exhibits normal muscle tone.  Skin: Skin is warm and dry. No rash noted. He is not diaphoretic. No erythema. No pallor.  Psychiatric: He has a normal mood and affect. His behavior is normal. Judgment and thought content normal.  Nursing note and vitals reviewed.  BP 110/80 (BP Location: Right Arm, Patient Position: Sitting, Cuff Size: Normal)   Pulse 80   Temp 98.1 F (36.7 C) (Oral)   Ht  (1.778 m)   Wt 234 lb 9.6 oz (106.4 kg)   SpO2 96%   BMI 33.66 kg/m  Wt Readings from Last 3 Encounters:  07/04/16 234 lb 9.6 oz (106.4 kg)  06/23/15 204 lb 6.4 oz (92.7 kg)  12/24/13 220 lb (99.8 kg)     Lab Results  Component Value Date   WBC 4.5 06/26/2015   HGB 16.6 06/26/2015   HCT 48.0 06/26/2015   PLT 200.0 06/26/2015   GLUCOSE 78 06/26/2015   CHOL 143 06/26/2015   TRIG 112.0 06/26/2015   HDL 38.60 (L) 06/26/2015    LDLCALC 82 06/26/2015   ALT 18 06/26/2015   AST 13 06/26/2015   NA 139 06/26/2015   K 4.0 06/26/2015   CL 104 06/26/2015   CREATININE 0.96 06/26/2015   BUN 15 06/26/2015   CO2 26 06/26/2015   TSH 3.98 06/26/2015   PSA 0.30 06/26/2015   MICROALBUR 2.1 (H) 06/26/2015    No results found.   Assessment & Plan:  Plan  Scott Sanchez does not currently have medications on file.  No orders of the defined types were placed in this encounter.   Problem List Items Addressed This Visit      Unprioritized   Preventative health care - Primary    See avs Check labs ghm utd      Relevant Orders   Comprehensive metabolic panel   Lipid panel   CBC with Differential/Platelet   TSH   PSA   POCT urinalysis dipstick    Other Visit Diagnoses   None.     Follow-up: Return in about 1 year (around 07/04/2017) for annual exam.  Donato Schultz, DO

## 2016-07-04 NOTE — Assessment & Plan Note (Signed)
See avs Check labs ghm utd 

## 2016-07-05 ENCOUNTER — Other Ambulatory Visit (INDEPENDENT_AMBULATORY_CARE_PROVIDER_SITE_OTHER): Payer: 59

## 2016-07-05 DIAGNOSIS — Z0000 Encounter for general adult medical examination without abnormal findings: Secondary | ICD-10-CM

## 2016-07-05 DIAGNOSIS — R319 Hematuria, unspecified: Secondary | ICD-10-CM

## 2016-07-05 LAB — PSA: PSA: 0.29 ng/mL (ref 0.10–4.00)

## 2016-07-05 LAB — POCT URINALYSIS DIPSTICK
BILIRUBIN UA: NEGATIVE
GLUCOSE UA: NEGATIVE
KETONES UA: NEGATIVE
Leukocytes, UA: NEGATIVE
Nitrite, UA: NEGATIVE
Protein, UA: NEGATIVE
Urobilinogen, UA: 0.2
pH, UA: 6

## 2016-07-05 LAB — CBC WITH DIFFERENTIAL/PLATELET
BASOS PCT: 0.8 % (ref 0.0–3.0)
Basophils Absolute: 0 10*3/uL (ref 0.0–0.1)
Eosinophils Absolute: 0.5 10*3/uL (ref 0.0–0.7)
Eosinophils Relative: 10.7 % — ABNORMAL HIGH (ref 0.0–5.0)
HEMATOCRIT: 47.2 % (ref 39.0–52.0)
Hemoglobin: 16.7 g/dL (ref 13.0–17.0)
LYMPHS PCT: 32.8 % (ref 12.0–46.0)
Lymphs Abs: 1.6 10*3/uL (ref 0.7–4.0)
MCHC: 35.4 g/dL (ref 30.0–36.0)
MCV: 87.9 fl (ref 78.0–100.0)
Monocytes Absolute: 0.4 10*3/uL (ref 0.1–1.0)
Monocytes Relative: 8.7 % (ref 3.0–12.0)
Neutro Abs: 2.3 10*3/uL (ref 1.4–7.7)
Neutrophils Relative %: 47 % (ref 43.0–77.0)
PLATELETS: 224 10*3/uL (ref 150.0–400.0)
RBC: 5.36 Mil/uL (ref 4.22–5.81)
RDW: 12.9 % (ref 11.5–15.5)
WBC: 4.9 10*3/uL (ref 4.0–10.5)

## 2016-07-05 LAB — LIPID PANEL
CHOL/HDL RATIO: 4
Cholesterol: 164 mg/dL (ref 0–200)
HDL: 42.7 mg/dL (ref >39.00)
LDL CALC: 93 mg/dL (ref 0–99)
NONHDL: 121.12
Triglycerides: 142 mg/dL (ref 0.0–149.0)
VLDL: 28.4 mg/dL (ref 0.0–40.0)

## 2016-07-05 LAB — TSH: TSH: 3.49 u[IU]/mL (ref 0.35–4.50)

## 2016-07-05 NOTE — Addendum Note (Signed)
Addended by: Harley Alto on: 07/05/2016 08:56 AM   Modules accepted: Orders

## 2016-07-06 LAB — URINE CULTURE: Organism ID, Bacteria: NO GROWTH

## 2016-07-15 ENCOUNTER — Telehealth: Payer: Self-pay | Admitting: Family Medicine

## 2016-07-15 NOTE — Telephone Encounter (Signed)
+ ?   Allergies-- eosinophils are elevated Rest of the labs are normal  Discussed with patient and he stated he was a little stuffy right now. He wanted to make sure the rest of the labs were ok, I made him aware they were Normal. He verbalized understanding.   KP

## 2016-07-15 NOTE — Telephone Encounter (Signed)
Pt called in returning CMA's call back for lab results.    CB: [phone]

## 2016-10-17 ENCOUNTER — Ambulatory Visit (INDEPENDENT_AMBULATORY_CARE_PROVIDER_SITE_OTHER): Payer: 59

## 2016-10-17 DIAGNOSIS — Z23 Encounter for immunization: Secondary | ICD-10-CM

## 2017-01-03 ENCOUNTER — Other Ambulatory Visit: Payer: Self-pay | Admitting: Urology

## 2017-01-03 DIAGNOSIS — N200 Calculus of kidney: Secondary | ICD-10-CM | POA: Diagnosis not present

## 2017-01-13 DIAGNOSIS — N200 Calculus of kidney: Secondary | ICD-10-CM | POA: Diagnosis not present

## 2017-01-28 ENCOUNTER — Encounter (HOSPITAL_COMMUNITY): Payer: Self-pay | Admitting: *Deleted

## 2017-01-30 ENCOUNTER — Encounter (HOSPITAL_COMMUNITY): Payer: Self-pay | Admitting: General Practice

## 2017-01-30 ENCOUNTER — Encounter (HOSPITAL_COMMUNITY)
Admission: RE | Disposition: A | Discharge status: Home / Self Care | Payer: Self-pay | Source: Ambulatory Visit | Attending: Urology

## 2017-01-30 ENCOUNTER — Ambulatory Visit (HOSPITAL_COMMUNITY): Payer: 59

## 2017-01-30 ENCOUNTER — Ambulatory Visit (HOSPITAL_COMMUNITY)
Admission: RE | Admit: 2017-01-30 | Discharge: 2017-01-30 | Disposition: A | Discharge status: Home / Self Care | Payer: 59 | Source: Ambulatory Visit | Attending: Urology | Admitting: Urology

## 2017-01-30 DIAGNOSIS — Z01818 Encounter for other preprocedural examination: Secondary | ICD-10-CM | POA: Diagnosis not present

## 2017-01-30 DIAGNOSIS — N200 Calculus of kidney: Secondary | ICD-10-CM | POA: Diagnosis not present

## 2017-01-30 HISTORY — DX: Personal history of urinary calculi: Z87.442

## 2017-01-30 HISTORY — PX: EXTRACORPOREAL SHOCK WAVE LITHOTRIPSY: SHX1557

## 2017-01-30 SURGERY — LITHOTRIPSY, ESWL
Anesthesia: LOCAL | Laterality: Left

## 2017-01-30 MED ORDER — DIPHENHYDRAMINE HCL 25 MG PO CAPS
25.0000 mg | ORAL_CAPSULE | ORAL | Status: AC
  Administered 2017-01-30: 25 mg via ORAL
  Filled 2017-01-30: qty 1

## 2017-01-30 MED ORDER — OXYCODONE-ACETAMINOPHEN 5-325 MG PO TABS: 1.0000 | ORAL_TABLET | Freq: Four times a day (QID) | ORAL | 0 refills | Status: DC | PRN

## 2017-01-30 MED ORDER — TAMSULOSIN HCL 0.4 MG PO CAPS: 0.4000 mg | ORAL_CAPSULE | Freq: Every day | ORAL | 0 refills | Status: DC

## 2017-01-30 MED ORDER — SODIUM CHLORIDE 0.9 % IV SOLN
INTRAVENOUS | Status: DC
  Administered 2017-01-30: 08:00:00 via INTRAVENOUS

## 2017-01-30 MED ORDER — DIAZEPAM 5 MG PO TABS
10.0000 mg | ORAL_TABLET | ORAL | Status: AC
  Administered 2017-01-30: 10 mg via ORAL
  Filled 2017-01-30: qty 2

## 2017-01-30 MED ORDER — SENNOSIDES-DOCUSATE SODIUM 8.6-50 MG PO TABS: 1.0000 | ORAL_TABLET | Freq: Two times a day (BID) | ORAL | 0 refills | Status: DC

## 2017-01-30 MED ORDER — CIPROFLOXACIN HCL 500 MG PO TABS
500.0000 mg | ORAL_TABLET | ORAL | Status: AC
  Administered 2017-01-30: 500 mg via ORAL
  Filled 2017-01-30: qty 1

## 2017-01-30 NOTE — Discharge Instructions (Signed)
1 - You may have urinary urgency (bladder spasms), bloody urine on / off, and pass small stone fragments x several das. This is normal. °  °2 - Call MD or go to ER for fever >102, severe pain / nausea / vomiting not relieved by medications, or acute change in medical status °

## 2017-01-30 NOTE — Brief Op Note (Signed)
01/30/2017  10:07 AM  PATIENT:  Scott Sanchez  41 y.o. male  PRE-OPERATIVE DIAGNOSIS:  LEFT LOWER POLE 12MM STONE, LEFT UPPER POLE STONE 4MM  POST-OPERATIVE DIAGNOSIS:  * No post-op diagnosis entered *  PROCEDURE:  Procedure(s): LEFT EXTRACORPOREAL SHOCK WAVE LITHOTRIPSY (ESWL) (Left)  SURGEON:  Surgeon(s) and Role:    * Alexis Frock, MD - Primary  PHYSICIAN ASSISTANT:   ASSISTANTS: none   ANESTHESIA:   MAC  EBL:  No intake/output data recorded.  BLOOD ADMINISTERED:none  DRAINS: none   LOCAL MEDICATIONS USED:  NONE  SPECIMEN:  No Specimen  DISPOSITION OF SPECIMEN:  N/A  COUNTS:  YES  TOURNIQUET:  * No tourniquets in log *  DICTATION: .Note written in paper chart  PLAN OF CARE: Discharge to home after PACU  PATIENT DISPOSITION:  Short Stay   Delay start of Pharmacological VTE agent (>24hrs) due to surgical blood loss or risk of bleeding: no

## 2017-01-30 NOTE — H&P (Signed)
Scott Sanchez is an 41 y.o. male.    Chief Complaint: Pre-op LEFT shockwave lithotripsy  HPI:   1 - Left Renal Stones - Left 4mm upper and 12mm lower pole intrarenal stones by office KUB 01/2017 on evaluation of occasional left flank pain. No ureteral stones or renal pelvis stones noted. Feels similar to prior colic.  Today "Scott Sanchez" is seen to proceed with LEFT shockwave lithotripsy for goal of treating what may be intermitantly obstructing stones and to prevent stone growth to size that would require percutaneous surgery. No interval fevers. Most recent UA without infectious parameters.   Past Medical History:  Diagnosis Date  . History of kidney stones     Past Surgical History:  Procedure Laterality Date  . KIDNEY STONE SURGERY  y-3  . LITHOTRIPSY  2008    Family History  Problem Relation Age of Onset  . Diabetes Mellitus II Father   . Hyperlipidemia Father    Social History:  reports that he has never smoked. He has never used smokeless tobacco. He reports that he drinks alcohol. He reports that he does not use drugs.  Allergies: No Known Allergies  No prescriptions prior to admission.    No results found for this or any previous visit (from the past 48 hour(s)). No results found.  Review of Systems  Constitutional: Negative.  Negative for chills and fever.  HENT: Negative.   Eyes: Negative.   Respiratory: Negative.   Cardiovascular: Negative.   Gastrointestinal: Negative.  Negative for nausea and vomiting.  Genitourinary: Positive for flank pain.  Skin: Negative.   Neurological: Negative.   Endo/Heme/Allergies: Negative.   Psychiatric/Behavioral: Negative.     There were no vitals taken for this visit. Physical Exam  Constitutional: He appears well-developed.  HENT:  Head: Normocephalic.  Eyes: Pupils are equal, round, and reactive to light.  Neck: Normal range of motion.  Cardiovascular: Normal rate.   Respiratory: Effort normal.  GI: Soft.   Genitourinary: Penis normal.  Genitourinary Comments: No CVAT at present.   Musculoskeletal: Normal range of motion.  Neurological: He is alert.  Skin: Skin is warm.     Assessment/Plan  1 - Left Renal Stones - Discussed that non-obstructing intra-renal stones may NOT be source of his occasional flank pain as well as role of SWL to help treat current stones to hopefully help pain and hopefully prevent / slow progression to more major surgery.  Risks, benefits, alternatives, expected peri-treatment course discussed previously and reiterated today.   Sebastian AcheMANNY, Wilburta Milbourn, MD 01/30/2017, 6:23 AM

## 2017-02-13 DIAGNOSIS — N200 Calculus of kidney: Secondary | ICD-10-CM | POA: Diagnosis not present

## 2017-02-20 ENCOUNTER — Encounter (HOSPITAL_COMMUNITY): Payer: Self-pay | Admitting: Urology

## 2017-04-17 DIAGNOSIS — N201 Calculus of ureter: Secondary | ICD-10-CM | POA: Diagnosis not present

## 2017-04-18 DIAGNOSIS — N201 Calculus of ureter: Secondary | ICD-10-CM | POA: Diagnosis not present

## 2017-05-01 DIAGNOSIS — N201 Calculus of ureter: Secondary | ICD-10-CM | POA: Diagnosis not present

## 2017-06-02 DIAGNOSIS — N202 Calculus of kidney with calculus of ureter: Secondary | ICD-10-CM | POA: Diagnosis not present

## 2017-07-07 ENCOUNTER — Encounter: Payer: 59 | Admitting: Family Medicine

## 2017-07-25 ENCOUNTER — Encounter: Payer: Self-pay | Admitting: Family Medicine

## 2017-07-25 ENCOUNTER — Ambulatory Visit (INDEPENDENT_AMBULATORY_CARE_PROVIDER_SITE_OTHER): Payer: 59 | Admitting: Family Medicine

## 2017-07-25 VITALS — BP 109/75 | HR 79 | Temp 98.6°F | Resp 20 | Ht 71.0 in | Wt 237.0 lb

## 2017-07-25 DIAGNOSIS — R109 Unspecified abdominal pain: Secondary | ICD-10-CM | POA: Diagnosis not present

## 2017-07-25 DIAGNOSIS — Z0000 Encounter for general adult medical examination without abnormal findings: Secondary | ICD-10-CM

## 2017-07-25 DIAGNOSIS — R519 Headache, unspecified: Secondary | ICD-10-CM | POA: Insufficient documentation

## 2017-07-25 DIAGNOSIS — R51 Headache: Secondary | ICD-10-CM | POA: Diagnosis not present

## 2017-07-25 LAB — CBC WITH DIFFERENTIAL/PLATELET
BASOS PCT: 1.1 % (ref 0.0–3.0)
Basophils Absolute: 0 10*3/uL (ref 0.0–0.1)
EOS PCT: 5.1 % — AB (ref 0.0–5.0)
Eosinophils Absolute: 0.2 10*3/uL (ref 0.0–0.7)
HCT: 41.9 % (ref 39.0–52.0)
HEMOGLOBIN: 14.6 g/dL (ref 13.0–17.0)
LYMPHS PCT: 52.1 % — AB (ref 12.0–46.0)
Lymphs Abs: 2.2 10*3/uL (ref 0.7–4.0)
MCHC: 34.9 g/dL (ref 30.0–36.0)
MCV: 88.7 fl (ref 78.0–100.0)
Monocytes Absolute: 0.5 10*3/uL (ref 0.1–1.0)
Monocytes Relative: 12 % (ref 3.0–12.0)
Neutro Abs: 1.3 10*3/uL — ABNORMAL LOW (ref 1.4–7.7)
Neutrophils Relative %: 29.7 % — ABNORMAL LOW (ref 43.0–77.0)
Platelets: 182 10*3/uL (ref 150.0–400.0)
RBC: 4.73 Mil/uL (ref 4.22–5.81)
RDW: 13.7 % (ref 11.5–15.5)
WBC: 4.3 10*3/uL (ref 4.0–10.5)

## 2017-07-25 LAB — COMPREHENSIVE METABOLIC PANEL
ALBUMIN: 4.2 g/dL (ref 3.5–5.2)
ALT: 59 U/L — AB (ref 0–53)
AST: 35 U/L (ref 0–37)
Alkaline Phosphatase: 116 U/L (ref 39–117)
BILIRUBIN TOTAL: 2 mg/dL — AB (ref 0.2–1.2)
BUN: 14 mg/dL (ref 6–23)
CALCIUM: 9.3 mg/dL (ref 8.4–10.5)
CO2: 29 meq/L (ref 19–32)
Chloride: 103 mEq/L (ref 96–112)
Creatinine, Ser: 1.04 mg/dL (ref 0.40–1.50)
GFR: 82.67 mL/min (ref >60.00)
Glucose, Bld: 82 mg/dL (ref 70–99)
Potassium: 4.2 mEq/L (ref 3.5–5.1)
Sodium: 137 mEq/L (ref 135–145)
Total Protein: 6.4 g/dL (ref 6.0–8.3)

## 2017-07-25 LAB — POC URINALSYSI DIPSTICK (AUTOMATED)
Bilirubin, UA: NEGATIVE
GLUCOSE UA: NEGATIVE
Ketones, UA: NEGATIVE
Leukocytes, UA: NEGATIVE
Nitrite, UA: NEGATIVE
PROTEIN UA: NEGATIVE
RBC UA: NEGATIVE
Spec Grav, UA: 1.02 (ref 1.010–1.025)
UROBILINOGEN UA: 0.2 U/dL
pH, UA: 6 (ref 5.0–8.0)

## 2017-07-25 LAB — SEDIMENTATION RATE: SED RATE: 2 mm/h (ref 0–15)

## 2017-07-25 LAB — LIPID PANEL
CHOLESTEROL: 137 mg/dL (ref 0–200)
HDL: 25.4 mg/dL — ABNORMAL LOW (ref >39.00)
LDL CALC: 82 mg/dL (ref 0–99)
NonHDL: 111.67
Total CHOL/HDL Ratio: 5
Triglycerides: 149 mg/dL (ref 0.0–149.0)
VLDL: 29.8 mg/dL (ref 0.0–40.0)

## 2017-07-25 LAB — PSA: PSA: 0.26 ng/mL (ref 0.10–4.00)

## 2017-07-25 LAB — TSH: TSH: 4.67 u[IU]/mL — ABNORMAL HIGH (ref 0.35–4.50)

## 2017-07-25 NOTE — Patient Instructions (Signed)
Preventive Care 40-64 Years, Male Preventive care refers to lifestyle choices and visits with your health care provider that can promote health and wellness. What does preventive care include?  A yearly physical exam. This is also called an annual well check.  Dental exams once or twice a year.  Routine eye exams. Ask your health care provider how often you should have your eyes checked.  Personal lifestyle choices, including: ? Daily care of your teeth and gums. ? Regular physical activity. ? Eating a healthy diet. ? Avoiding tobacco and drug use. ? Limiting alcohol use. ? Practicing safe sex. ? Taking low-dose aspirin every day starting at age 43. What happens during an annual well check? The services and screenings done by your health care provider during your annual well check will depend on your age, overall health, lifestyle risk factors, and family history of disease. Counseling Your health care provider may ask you questions about your:  Alcohol use.  Tobacco use.  Drug use.  Emotional well-being.  Home and relationship well-being.  Sexual activity.  Eating habits.  Work and work Statistician.  Screening You may have the following tests or measurements:  Height, weight, and BMI.  Blood pressure.  Lipid and cholesterol levels. These may be checked every 5 years, or more frequently if you are over 43 years old.  Skin check.  Lung cancer screening. You may have this screening every year starting at age 43 if you have a 30-pack-year history of smoking and currently smoke or have quit within the past 15 years.  Fecal occult blood test (FOBT) of the stool. You may have this test every year starting at age 43.  Flexible sigmoidoscopy or colonoscopy. You may have a sigmoidoscopy every 5 years or a colonoscopy every 10 years starting at age 43.  Prostate cancer screening. Recommendations will vary depending on your family history and other risks.  Hepatitis C  blood test.  Hepatitis B blood test.  Sexually transmitted disease (STD) testing.  Diabetes screening. This is done by checking your blood sugar (glucose) after you have not eaten for a while (fasting). You may have this done every 1-3 years.  Discuss your test results, treatment options, and if necessary, the need for more tests with your health care provider. Vaccines Your health care provider may recommend certain vaccines, such as:  Influenza vaccine. This is recommended every year.  Tetanus, diphtheria, and acellular pertussis (Tdap, Td) vaccine. You may need a Td booster every 10 years.  Varicella vaccine. You may need this if you have not been vaccinated.  Zoster vaccine. You may need this after age 43.  Measles, mumps, and rubella (MMR) vaccine. You may need at least one dose of MMR if you were born in 1957 or later. You may also need a second dose.  Pneumococcal 13-valent conjugate (PCV13) vaccine. You may need this if you have certain conditions and have not been vaccinated.  Pneumococcal polysaccharide (PPSV23) vaccine. You may need one or two doses if you smoke cigarettes or if you have certain conditions.  Meningococcal vaccine. You may need this if you have certain conditions.  Hepatitis A vaccine. You may need this if you have certain conditions or if you travel or work in places where you may be exposed to hepatitis A.  Hepatitis B vaccine. You may need this if you have certain conditions or if you travel or work in places where you may be exposed to hepatitis B.  Haemophilus influenzae type b (Hib) vaccine.  You may need this if you have certain risk factors.  Talk to your health care provider about which screenings and vaccines you need and how often you need them. This information is not intended to replace advice given to you by your health care provider. Make sure you discuss any questions you have with your health care provider. Document Released: 12/15/2015  Document Revised: 08/07/2016 Document Reviewed: 09/19/2015 Elsevier Interactive Patient Education  2017 [employer]  

## 2017-07-25 NOTE — Assessment & Plan Note (Signed)
ghm utd Check labs See AVS 

## 2017-07-25 NOTE — Assessment & Plan Note (Signed)
Hx kidney stones Check urine  If pain worsens ct vs f/u urology

## 2017-07-25 NOTE — Progress Notes (Signed)
Patient ID: Scott Sanchez, male    DOB: Apr 02, 1976  Age: 41 y.o. MRN: [id number]    Subjective:  Subjective  HPI Scott Sanchez presents for cpe.  Pt had problem with kidney stones in March and had lithotripsy --- he never needed the pain meds. He c/o mild headaches last 3-4 days and ringing in R ear.  He doesn't take anything for them .    Review of Systems  Constitutional: Negative.  Negative for fatigue and unexpected weight change.  HENT: Positive for tinnitus. Negative for congestion, ear pain, hearing loss, nosebleeds, postnasal drip, rhinorrhea, sinus pressure and sneezing.   Eyes: Negative for photophobia, discharge, itching and visual disturbance.  Respiratory: Negative.  Negative for cough and shortness of breath.   Cardiovascular: Negative.  Negative for chest pain and palpitations.  Gastrointestinal: Negative for abdominal distention, abdominal pain, anal bleeding, blood in stool and constipation.  Endocrine: Negative.   Genitourinary: Negative.   Musculoskeletal: Negative.   Skin: Negative.   Allergic/Immunologic: Negative.   Neurological: Positive for headaches. Negative for dizziness, weakness, light-headedness and numbness.  Psychiatric/Behavioral: Negative for agitation, confusion, decreased concentration, dysphoric mood, sleep disturbance and suicidal ideas. The patient is not nervous/anxious.     History Past Medical History:  Diagnosis Date  . History of kidney stones     He has a past surgical history that includes Kidney stone surgery (y-3); Lithotripsy (2008); and Extracorporeal shock wave lithotripsy (Left, 01/30/2017).   His family history includes Diabetes Mellitus II in his father; Hyperlipidemia in his father.He reports that he has never smoked. He has never used smokeless tobacco. He reports that he drinks alcohol. He reports that he does not use drugs.  Current Outpatient Prescriptions on File Prior to Visit  Medication Sig Dispense Refill  .  senna-docusate (SENOKOT-S) 8.6-50 MG tablet Take 1 tablet by mouth 2 (two) times daily. While taking strong pain meds to prevent constipation. 30 tablet 0   No current facility-administered medications on file prior to visit.      Objective:  Objective  Physical Exam  Constitutional: He is oriented to person, place, and time. He appears well-developed and well-nourished. No distress.  HENT:  Head: Normocephalic and atraumatic.  Right Ear: External ear normal.  Left Ear: External ear normal.  Nose: Nose normal.  Mouth/Throat: Oropharynx is clear and moist. No oropharyngeal exudate.  Eyes: Pupils are equal, round, and reactive to light. Conjunctivae and EOM are normal. Right eye exhibits no discharge. Left eye exhibits no discharge.  Neck: Normal range of motion. Neck supple. No JVD present. No thyromegaly present.  Cardiovascular: Normal rate, regular rhythm and intact distal pulses.  Exam reveals no gallop and no friction rub.   No murmur heard. Pulmonary/Chest: Effort normal and breath sounds normal. No respiratory distress. He has no wheezes. He has no rales. He exhibits no tenderness.  Abdominal: Soft. Bowel sounds are normal. He exhibits no distension and no mass. There is no tenderness. There is no rebound and no guarding.  Genitourinary: Rectum normal, prostate normal and penis normal. Rectal exam shows guaiac negative stool.  Musculoskeletal: Normal range of motion. He exhibits no edema or tenderness.  Lymphadenopathy:    He has no cervical adenopathy.  Neurological: He is alert and oriented to person, place, and time. He displays normal reflexes. He exhibits normal muscle tone.  Skin: Skin is warm and dry. No rash noted. He is not diaphoretic. No erythema. No pallor.  Psychiatric: He has a normal mood and affect.  His behavior is normal. Judgment and thought content normal.  Nursing note and vitals reviewed.  BP 109/75   Pulse 79   Temp 98.6 F (37 C)   Resp 20   Ht 5\' 11"   (1.803 m)   Wt 237 lb (107.5 kg)   SpO2 95%   BMI 33.05 kg/m  Wt Readings from Last 3 Encounters:  07/25/17 237 lb (107.5 kg)  01/30/17 236 lb (107 kg)  07/04/16 234 lb 9.6 oz (106.4 kg)     Lab Results  Component Value Date   WBC 4.9 07/05/2016   HGB 16.7 07/05/2016   HCT 47.2 07/05/2016   PLT 224.0 07/05/2016   GLUCOSE 78 06/26/2015   CHOL 164 07/05/2016   TRIG 142.0 07/05/2016   HDL 42.70 07/05/2016   LDLCALC 93 07/05/2016   ALT 18 06/26/2015   AST 13 06/26/2015   NA 139 06/26/2015   K 4.0 06/26/2015   CL 104 06/26/2015   CREATININE 0.96 06/26/2015   BUN 15 06/26/2015   CO2 26 06/26/2015   TSH 3.49 07/05/2016   PSA 0.29 07/05/2016   MICROALBUR 2.1 (H) 06/26/2015    Dg Abd 1 View  Result Date: 01/30/2017 CLINICAL DATA:  Preop for kidney stone on the left EXAM: ABDOMEN - 1 VIEW COMPARISON:  KUB of 01/03/2017 FINDINGS: There is no change in left renal calculi the largest stone in the lower pole. No right renal calculi are seen. No calcifications are noted along the expected courses of the ureters. IMPRESSION: No change in left renal calculi, the largest in the lower pole. Electronically Signed   By: Dwyane Dee M.D.   On: 01/30/2017 08:14     Assessment & Plan:  Plan  I have discontinued Scott Sanchez oxyCODONE-acetaminophen and tamsulosin. I am also having him maintain his senna-docusate.  No orders of the defined types were placed in this encounter.   Problem List Items Addressed This Visit      Unprioritized   Left flank pain    Hx kidney stones Check urine  If pain worsens ct vs f/u urology      Relevant Orders   CBC with Differential/Platelet   Comprehensive metabolic panel   POCT Urinalysis Dipstick (Automated) (Completed)   Persistent headaches   Relevant Orders   Sedimentation rate   Preventative health care - Primary    ghm utd Check labs See AVS      Relevant Orders   CBC with Differential/Platelet   Lipid panel   PSA   TSH    Comprehensive metabolic panel   POCT Urinalysis Dipstick (Automated) (Completed)      Follow-up: Return in about 1 year (around 07/25/2018) for annual exam, fasting.  Scott Schultz, DO

## 2017-07-28 ENCOUNTER — Other Ambulatory Visit: Payer: Self-pay | Admitting: Family Medicine

## 2017-07-28 DIAGNOSIS — R7989 Other specified abnormal findings of blood chemistry: Secondary | ICD-10-CM

## 2017-07-29 ENCOUNTER — Other Ambulatory Visit: Payer: 59

## 2017-07-29 ENCOUNTER — Other Ambulatory Visit (INDEPENDENT_AMBULATORY_CARE_PROVIDER_SITE_OTHER): Payer: 59

## 2017-07-29 DIAGNOSIS — R7989 Other specified abnormal findings of blood chemistry: Secondary | ICD-10-CM

## 2017-07-29 DIAGNOSIS — R946 Abnormal results of thyroid function studies: Secondary | ICD-10-CM | POA: Diagnosis not present

## 2017-07-29 LAB — COMPREHENSIVE METABOLIC PANEL
ALBUMIN: 4 g/dL (ref 3.5–5.2)
ALK PHOS: 135 U/L — AB (ref 39–117)
ALT: 91 U/L — AB (ref 0–53)
AST: 53 U/L — ABNORMAL HIGH (ref 0–37)
BILIRUBIN TOTAL: 2 mg/dL — AB (ref 0.2–1.2)
BUN: 12 mg/dL (ref 6–23)
CO2: 29 mEq/L (ref 19–32)
Calcium: 9.2 mg/dL (ref 8.4–10.5)
Chloride: 103 mEq/L (ref 96–112)
Creatinine, Ser: 1.05 mg/dL (ref 0.40–1.50)
GFR: 81.76 mL/min (ref >60.00)
GLUCOSE: 87 mg/dL (ref 70–99)
Potassium: 4 mEq/L (ref 3.5–5.1)
SODIUM: 138 meq/L (ref 135–145)
TOTAL PROTEIN: 6.7 g/dL (ref 6.0–8.3)

## 2017-07-29 LAB — CBC WITH DIFFERENTIAL/PLATELET
BASOS ABS: 0 10*3/uL (ref 0.0–0.1)
Basophils Relative: 0.5 % (ref 0.0–3.0)
Eosinophils Absolute: 0.2 10*3/uL (ref 0.0–0.7)
Eosinophils Relative: 3 % (ref 0.0–5.0)
HEMATOCRIT: 40.8 % (ref 39.0–52.0)
Hemoglobin: 14.4 g/dL (ref 13.0–17.0)
LYMPHS ABS: 3.3 10*3/uL (ref 0.7–4.0)
Lymphocytes Relative: 60 % — ABNORMAL HIGH (ref 12.0–46.0)
MCHC: 35.1 g/dL (ref 30.0–36.0)
MCV: 88 fl (ref 78.0–100.0)
MONO ABS: 0.5 10*3/uL (ref 0.1–1.0)
Monocytes Relative: 9.3 % (ref 3.0–12.0)
NEUTROS PCT: 27.2 % — AB (ref 43.0–77.0)
Neutro Abs: 1.5 10*3/uL (ref 1.4–7.7)
PLATELETS: 147 10*3/uL — AB (ref 150.0–400.0)
RBC: 4.64 Mil/uL (ref 4.22–5.81)
RDW: 13.8 % (ref 11.5–15.5)
WBC: 5.4 10*3/uL (ref 4.0–10.5)

## 2017-07-29 LAB — T4, FREE: Free T4: 1 ng/dL (ref 0.60–1.60)

## 2017-07-29 LAB — TSH: TSH: 4.13 u[IU]/mL (ref 0.35–4.50)

## 2017-07-29 LAB — T3, FREE: T3 FREE: 3.4 pg/mL (ref 2.3–4.2)

## 2017-07-30 LAB — PATHOLOGIST SMEAR REVIEW

## 2017-07-31 ENCOUNTER — Other Ambulatory Visit: Payer: Self-pay | Admitting: Family Medicine

## 2017-07-31 DIAGNOSIS — R7989 Other specified abnormal findings of blood chemistry: Secondary | ICD-10-CM

## 2017-07-31 DIAGNOSIS — R945 Abnormal results of liver function studies: Principal | ICD-10-CM

## 2017-08-01 ENCOUNTER — Other Ambulatory Visit: Payer: Self-pay | Admitting: Family Medicine

## 2017-08-01 DIAGNOSIS — R945 Abnormal results of liver function studies: Secondary | ICD-10-CM

## 2017-08-14 ENCOUNTER — Other Ambulatory Visit (INDEPENDENT_AMBULATORY_CARE_PROVIDER_SITE_OTHER): Payer: 59

## 2017-08-14 DIAGNOSIS — R7989 Other specified abnormal findings of blood chemistry: Secondary | ICD-10-CM

## 2017-08-14 DIAGNOSIS — K7689 Other specified diseases of liver: Secondary | ICD-10-CM

## 2017-08-14 DIAGNOSIS — R945 Abnormal results of liver function studies: Secondary | ICD-10-CM

## 2017-08-14 LAB — CBC WITH DIFFERENTIAL/PLATELET
BASOS ABS: 0.1 10*3/uL (ref 0.0–0.1)
BASOS PCT: 1.5 % (ref 0.0–3.0)
EOS ABS: 0.1 10*3/uL (ref 0.0–0.7)
Eosinophils Relative: 3.4 % (ref 0.0–5.0)
HEMATOCRIT: 39.7 % (ref 39.0–52.0)
Hemoglobin: 13.7 g/dL (ref 13.0–17.0)
Lymphs Abs: 2.6 10*3/uL (ref 0.7–4.0)
MCHC: 34.5 g/dL (ref 30.0–36.0)
MCV: 90 fl (ref 78.0–100.0)
Monocytes Absolute: 0.4 10*3/uL (ref 0.1–1.0)
Monocytes Relative: 10.2 % (ref 3.0–12.0)
NEUTROS ABS: 0.8 10*3/uL — AB (ref 1.4–7.7)
NEUTROS PCT: 19.9 % — AB (ref 43.0–77.0)
PLATELETS: 145 10*3/uL — AB (ref 150.0–400.0)
RBC: 4.41 Mil/uL (ref 4.22–5.81)
RDW: 14.6 % (ref 11.5–15.5)
WBC: 4 10*3/uL (ref 4.0–10.5)

## 2017-08-14 LAB — FERRITIN: Ferritin: 438.6 ng/mL — ABNORMAL HIGH (ref 22.0–322.0)

## 2017-08-14 LAB — IBC PANEL
IRON: 92 ug/dL (ref 42–165)
SATURATION RATIOS: 34.2 % (ref 20.0–50.0)
TRANSFERRIN: 192 mg/dL — AB (ref 212.0–360.0)

## 2017-08-14 LAB — GAMMA GT: GGT: 44 U/L (ref 7–51)

## 2017-08-14 NOTE — Addendum Note (Signed)
Addended by: Harley AltoPRICE, Noah Pelaez M on: 08/14/2017 07:32 AM   Modules accepted: Orders

## 2017-08-15 LAB — HEPATITIS PANEL, ACUTE
HEP A IGM: NONREACTIVE
HEP B S AG: NONREACTIVE
HEP C AB: NONREACTIVE
Hep B C IgM: NONREACTIVE
SIGNAL TO CUT-OFF: 0.01 (ref <1.00)

## 2017-08-22 ENCOUNTER — Other Ambulatory Visit: Payer: Self-pay | Admitting: Family Medicine

## 2017-08-22 DIAGNOSIS — R7989 Other specified abnormal findings of blood chemistry: Secondary | ICD-10-CM

## 2017-09-17 ENCOUNTER — Other Ambulatory Visit: Payer: Self-pay | Admitting: Family

## 2017-09-17 DIAGNOSIS — D72820 Lymphocytosis (symptomatic): Secondary | ICD-10-CM

## 2017-09-18 ENCOUNTER — Ambulatory Visit (HOSPITAL_BASED_OUTPATIENT_CLINIC_OR_DEPARTMENT_OTHER): Payer: 59 | Admitting: Family

## 2017-09-18 ENCOUNTER — Other Ambulatory Visit (HOSPITAL_BASED_OUTPATIENT_CLINIC_OR_DEPARTMENT_OTHER): Payer: 59

## 2017-09-18 ENCOUNTER — Other Ambulatory Visit (HOSPITAL_COMMUNITY)
Admission: RE | Admit: 2017-09-18 | Discharge: 2017-09-18 | Disposition: A | Discharge status: Home / Self Care | Payer: 59 | Source: Ambulatory Visit | Attending: Hematology & Oncology | Admitting: Hematology & Oncology

## 2017-09-18 VITALS — BP 120/79 | HR 74 | Temp 98.4°F | Resp 16 | Wt 235.0 lb

## 2017-09-18 DIAGNOSIS — Z809 Family history of malignant neoplasm, unspecified: Secondary | ICD-10-CM | POA: Diagnosis not present

## 2017-09-18 DIAGNOSIS — D72820 Lymphocytosis (symptomatic): Secondary | ICD-10-CM

## 2017-09-18 DIAGNOSIS — Z8041 Family history of malignant neoplasm of ovary: Secondary | ICD-10-CM | POA: Diagnosis not present

## 2017-09-18 LAB — CBC WITH DIFFERENTIAL (CANCER CENTER ONLY)
BASO#: 0 10*3/uL (ref 0.0–0.2)
BASO%: 0.6 % (ref 0.0–2.0)
EOS%: 5.8 % (ref 0.0–7.0)
Eosinophils Absolute: 0.2 10*3/uL (ref 0.0–0.5)
HEMATOCRIT: 43.4 % (ref 38.7–49.9)
HEMOGLOBIN: 15.8 g/dL (ref 13.0–17.1)
LYMPH#: 2 10*3/uL (ref 0.9–3.3)
LYMPH%: 56.6 % — ABNORMAL HIGH (ref 14.0–48.0)
MCH: 31.4 pg (ref 28.0–33.4)
MCHC: 36.4 g/dL — ABNORMAL HIGH (ref 32.0–35.9)
MCV: 86 fL (ref 82–98)
MONO#: 0.4 10*3/uL (ref 0.1–0.9)
MONO%: 12.4 % (ref 0.0–13.0)
NEUT%: 24.6 % — AB (ref 40.0–80.0)
NEUTROS ABS: 0.9 10*3/uL — AB (ref 1.5–6.5)
Platelets: 172 10*3/uL (ref 145–400)
RBC: 5.03 10*6/uL (ref 4.20–5.70)
RDW: 13.2 % (ref 11.1–15.7)
WBC: 3.5 10*3/uL — AB (ref 4.0–10.0)

## 2017-09-18 LAB — CHCC SATELLITE - SMEAR

## 2017-09-18 LAB — CMP (CANCER CENTER ONLY)
ALT(SGPT): 58 U/L — ABNORMAL HIGH (ref 10–47)
AST: 36 U/L (ref 11–38)
Albumin: 3.9 g/dL (ref 3.3–5.5)
Alkaline Phosphatase: 100 U/L — ABNORMAL HIGH (ref 26–84)
BILIRUBIN TOTAL: 1.2 mg/dL (ref 0.20–1.60)
BUN, Bld: 13 mg/dL (ref 7–22)
CO2: 29 mEq/L (ref 18–33)
CREATININE: 1.2 mg/dL (ref 0.6–1.2)
Calcium: 9.5 mg/dL (ref 8.0–10.3)
Chloride: 107 mEq/L (ref 98–108)
GLUCOSE: 72 mg/dL — AB (ref 73–118)
Potassium: 4.3 mEq/L (ref 3.3–4.7)
Sodium: 141 mEq/L (ref 128–145)
Total Protein: 6.7 g/dL (ref 6.4–8.1)

## 2017-09-18 LAB — LACTATE DEHYDROGENASE: LDH: 189 U/L (ref 125–245)

## 2017-09-18 NOTE — Progress Notes (Signed)
Hematology/Oncology Consultation   Name: Scott Sanchez      MRN: [id number]    Location: Room/bed info not found  Date: 09/18/2017 Time:10:32 AM   REFERRING PHYSICIAN: Yvonne Lowne Chase, DO  REASON FOR CONSULT: Abnormal CBC    DIAGNOSIS: Elevated leukocyte count Mild leukopenia   HISTORY OF PRESENT ILLNESS: Scott Sanchez is a very pleasant 41 yo caucasian gentleman with recent history of mild leukopenia and elevated leukocyte count. He has been a little more fatigued and not has as much energy for the last month or so.  He has had no issue with frequent infections. No fever, chills, n/v, cough, rash, dizziness, SOB, chest pain, palpitations, abdominal pain or changes in bowel or bladder habits.  He has history of kidney stones and lithotripsy. His last lithotripsy was in March of this year. He states that his urine is red in the morning and afterwards is normal throughout the day. His urologist has suggested he drink more fluids.   No swelling or tenderness in his extremities. He has had some numbness and tingling in his fingertips that comes and goes.  He denies having any autoimmune issues (hyper or hypo thyroid, diabetes, lupus, etc...) No episodes of bleeding, bruising or petechiae. No lymphadenopathy found on exam.  No personal cancer history. Family history includes: maternal grandmother with ovarian and a maternal uncle that passed away from "brain cancer" as a child.  His appetite has been a little down lately. He is staying well hydrated. He states that his weight is stable.  He has 2 children ages 12 and 15 that are healthy.  He is not a smoker and rarely drinks alcohol.  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 He has had some recent work stress over an intense project that spanned over 3 months earlier this year.   ROS: All other 10 point review of systems is negative.   PAST MEDICAL HISTORY:   Past Medical History:  Diagnosis Date  . History of kidney  stones     ALLERGIES: No Known Allergies    MEDICATIONS:  No current outpatient prescriptions on file prior to visit.   No current facility-administered medications on file prior to visit.      PAST SURGICAL HISTORY Past Surgical History:  Procedure Laterality Date  . EXTRACORPOREAL SHOCK WAVE LITHOTRIPSY Left 01/30/2017   Procedure: LEFT EXTRACORPOREAL SHOCK WAVE LITHOTRIPSY (ESWL);  Surgeon: Scott Manny, MD;  Location: [facility];  Service: Urology;  Laterality: Left;  . KIDNEY STONE SURGERY  y-3  . LITHOTRIPSY  2008    FAMILY HISTORY: Family History  Problem Relation Age of Onset  . Diabetes Mellitus II Father   . Hyperlipidemia Father     SOCIAL HISTORY:  reports that he has never smoked. He has never used smokeless tobacco. He reports that he drinks alcohol. He reports that he does not use drugs.  PERFORMANCE STATUS: [name] is 1 - Symptomatic but completely ambulatory  PHYSICAL EXAM: Most Recent Vital Signs: Blood pressure 120/79, pulse 74, temperature 98.4 F (36.9 C), temperature source Oral, resp. rate 16, weight 235 lb (106.6 kg), SpO2 98 %. BP 120/79 (BP Location: Right Arm, Patient Position: Sitting)   Pulse 74   Temp 98.4 F (36.9 C) (Oral)   Resp 16   Wt 235 lb (106.6 kg)   SpO2 98%   BMI 32.78 kg/m   General Appearance:    Alert, cooperative, no distress, appears stated age  Head:      Normocephalic, without obvious abnormality, atraumatic  Eyes:    PERRL, conjunctiva/corneas clear, EOM's intact, fundi    benign, both eyes             Throat:   Lips, mucosa, and tongue normal; teeth and gums normal  Neck:   Supple, symmetrical, trachea midline, no adenopathy;       thyroid:  No enlargement/tenderness/nodules; no carotid   bruit or JVD  Back:     Symmetric, no curvature, ROM normal, no CVA tenderness  Lungs:     Clear to auscultation bilaterally, respirations unlabored  Chest wall:    No tenderness or deformity  Heart:     Regular rate and rhythm, S1 and S2 normal, no murmur, rub   or gallop  Abdomen:     Soft, non-tender, bowel sounds active all four quadrants,    no masses, no organomegaly        Extremities:   Extremities normal, atraumatic, no cyanosis or edema  Pulses:   2+ and symmetric all extremities  Skin:   Skin color, texture, turgor normal, no rashes or lesions  Lymph nodes:   Cervical, supraclavicular, and axillary nodes normal  Neurologic:   CNII-XII intact. Normal strength, sensation and reflexes      throughout    LABORATORY DATA:  Results for orders placed or performed in visit on 09/18/17 (from the past 48 hour(s))  CBC w/Diff     Status: Abnormal   Collection Time: 09/18/17  8:39 AM  Result Value Ref Range   WBC 3.5 (L) 4.0 - 10.0 10e3/uL   RBC 5.03 4.20 - 5.70 10e6/uL   HGB 15.8 13.0 - 17.1 g/dL   HCT 43.4 38.7 - 49.9 %   MCV 86 82 - 98 fL   MCH 31.4 28.0 - 33.4 pg   MCHC 36.4 (H) 32.0 - 35.9 g/dL   RDW 13.2 11.1 - 15.7 %   Platelets 172 145 - 400 10e3/uL   NEUT# 0.9 (L) 1.5 - 6.5 10e3/uL   LYMPH# 2.0 0.9 - 3.3 10e3/uL   MONO# 0.4 0.1 - 0.9 10e3/uL   Eosinophils Absolute 0.2 0.0 - 0.5 10e3/uL   BASO# 0.0 0.0 - 0.2 10e3/uL   NEUT% 24.6 (L) 40.0 - 80.0 %   LYMPH% 56.6 (H) 14.0 - 48.0 %   MONO% 12.4 0.0 - 13.0 %   EOS% 5.8 0.0 - 7.0 %   BASO% 0.6 0.0 - 2.0 %  CMP STAT     Status: Abnormal   Collection Time: 09/18/17  8:39 AM  Result Value Ref Range   Sodium 141 128 - 145 mEq/L   Potassium 4.3 3.3 - 4.7 mEq/L   Chloride 107 98 - 108 mEq/L   CO2 29 18 - 33 mEq/L   Glucose, Bld 72 (L) 73 - 118 mg/dL   BUN, Bld 13 7 - 22 mg/dL   Creat 1.2 0.6 - 1.2 mg/dl   Total Bilirubin 1.20 0.20 - 1.60 mg/dl   Alkaline Phosphatase 100 (H) 26 - 84 U/L   AST 36 11 - 38 U/L   ALT(SGPT) 58 (H) 10 - 47 U/L   Total Protein 6.7 6.4 - 8.1 g/dL   Albumin 3.9 3.3 - 5.5 g/dL   Calcium 9.5 8.0 - 10.3 mg/dL  Smear     Status: None   Collection Time: 09/18/17  8:39 AM  Result Value Ref Range    Smear Result Smear Available       RADIOGRAPHY: No results found.  PATHOLOGY: None  ASSESSMENT/PLAN: Scott Sanchez is a very pleasant 41 yo caucasian gentleman with recent history of mild leukopenia and elevated leukocyte count. His WBC count today is 3.5 and lymphocyte is 56.6% with Neutrophils 24.6%.  He is has some fatigue and decrease in appetite.  His smear was viewed by Dr. Marin Olp and was unremarkable.  We will see what his flow cytometry shows and then determine a plan if needed and schedule his follow-up appointment.   All questions were answered and he is in agreement with the plan. He will contact our office with any questions or concerns. We can certainly see him much sooner if need be.   He was discussed with and also seen by Dr. Marin Olp and he is in agreement with the aforementioned.   [facility]     Addendum:  I saw and examined the patient with Sarah. I agree with her assessment above.  I suspect that the flow cytometry will show [place] what the problem might be. I would have to think that there might be chronic lymphocytic leukemia (CLL) that might be the issue. My only concern would be that he does not have a leukocytosis. There may be a minimal lymphocytosis.  I looked at his blood smear. In reality, the blood smear did not really look like CLL. I do not see any smudge cells. The lymphocytes appeared a little bit large. He had normal red cell morphology. There were no hypersegmented polys. The platelets looked okay.  His exam is relatively unrevealing. He does not have any palpable lymph nodes. There is no splenomegaly.  If there is no evidence of CLL by flow cytometry, I think we can just follow this along. I not think that he has myelodysplasia at 41 years old. However, this is a consideration. However, the only way to make this diagnosis would be with a bone marrow biopsy. I just don't think we have to "go down that road."  We spent about 45 minutes with him.  We reviewed the lab work. I went over my recommendations. He asked quite a few very insightful questions. Hopefully, we gave him some "peace of mind."  We will plan to see him back based on the results of flow cytometry.  Lattie Haw, MD

## 2017-09-22 ENCOUNTER — Other Ambulatory Visit: Payer: 59

## 2017-09-22 ENCOUNTER — Ambulatory Visit: Payer: 59 | Admitting: Family

## 2017-10-10 LAB — FLOW CYTOMETRY - CHCC SATELLITE

## 2018-01-26 ENCOUNTER — Encounter: Payer: Self-pay | Admitting: Family Medicine

## 2018-01-26 ENCOUNTER — Ambulatory Visit (INDEPENDENT_AMBULATORY_CARE_PROVIDER_SITE_OTHER): Payer: 59 | Admitting: Family Medicine

## 2018-01-26 VITALS — BP 108/78 | HR 75 | Temp 98.2°F | Resp 16 | Ht 71.0 in | Wt 242.2 lb

## 2018-01-26 DIAGNOSIS — Z23 Encounter for immunization: Secondary | ICD-10-CM | POA: Diagnosis not present

## 2018-01-26 MED ORDER — TYPHOID VACCINE PO CPDR: 1.0000 | DELAYED_RELEASE_CAPSULE | ORAL | 0 refills | Status: DC

## 2018-01-26 NOTE — Progress Notes (Signed)
Patient ID: Scott Sanchez, male    DOB: [birth year]  Age: 42 y.o. MRN: [id number]    Subjective:  Subjective  HPI Scott Sanchez presents for vaccines because he is going to [place] in 2 weeks.    Review of Systems  Constitutional: Negative for appetite change, diaphoresis, fatigue and unexpected weight change.  Eyes: Negative for pain, redness and visual disturbance.  Respiratory: Negative for cough, chest tightness, shortness of breath and wheezing.   Cardiovascular: Negative for chest pain, palpitations and leg swelling.  Endocrine: Negative for cold intolerance, heat intolerance, polydipsia, polyphagia and polyuria.  Genitourinary: Negative for difficulty urinating, dysuria and frequency.  Neurological: Negative for dizziness, light-headedness, numbness and headaches.    History Past Medical History:  Diagnosis Date  . History of kidney stones     He has a past surgical history that includes Kidney stone surgery (y-3); Lithotripsy (2008); and Extracorporeal shock wave lithotripsy (Left, 01/30/2017).   His family history includes Diabetes Mellitus II in his father; Hyperlipidemia in his father.He reports that  has never smoked. he has never used smokeless tobacco. He reports that he drinks alcohol. He reports that he does not use drugs.  No current outpatient medications on file prior to visit.   No current facility-administered medications on file prior to visit.      Objective:  Objective  Physical Exam  Constitutional: He is oriented to person, place, and time. Vital signs are normal. He appears well-developed and well-nourished. He is sleeping.  HENT:  Head: Normocephalic and atraumatic.  Mouth/Throat: Oropharynx is clear and moist.  Eyes: EOM are normal. Pupils are equal, round, and reactive to light.  Neck: Normal range of motion. Neck supple. No thyromegaly present.  Cardiovascular: Normal rate and regular rhythm.  No murmur heard. Pulmonary/Chest: Effort normal  and breath sounds normal. No respiratory distress. He has no wheezes. He has no rales. He exhibits no tenderness.  Musculoskeletal: He exhibits no edema or tenderness.  Neurological: He is alert and oriented to person, place, and time.  Skin: Skin is warm and dry.  Psychiatric: He has a normal mood and affect. His behavior is normal. Judgment and thought content normal.  Nursing note and vitals reviewed.  BP 108/78 (BP Location: Right Arm, Cuff Size: Normal)   Pulse 75   Temp 98.2 F (36.8 C) (Oral)   Resp 16   Ht _0  (1.803 m)   Wt 242 lb 3.2 oz (109.9 kg)   SpO2 96%   BMI 33.78 kg/m  Wt Readings from Last 3 Encounters:  01/26/18 242 lb 3.2 oz (109.9 kg)  09/18/17 235 lb (106.6 kg)  07/25/17 237 lb (107.5 kg)     Lab Results  Component Value Date   WBC 3.5 (L) 09/18/2017   HGB 15.8 09/18/2017   HCT 43.4 09/18/2017   PLT 172 09/18/2017   GLUCOSE 72 (L) 09/18/2017   CHOL 137 07/25/2017   TRIG 149.0 07/25/2017   HDL 25.40 (L) 07/25/2017   LDLCALC 82 07/25/2017   ALT 58 (H) 09/18/2017   AST 36 09/18/2017   NA 141 09/18/2017   K 4.3 09/18/2017   CL 107 09/18/2017   CREATININE 1.2 09/18/2017   BUN 13 09/18/2017   CO2 29 09/18/2017   TSH 4.13 07/29/2017   PSA 0.26 07/25/2017   MICROALBUR 2.1 (H) 06/26/2015    No results found.   Assessment & Plan:  Plan  I am having Jeannett Senior start on typhoid.  Meds ordered this  encounter  Medications  . typhoid (VIVOTIF) DR capsule    Sig: Take 1 capsule by mouth every other day.    Dispense:  4 capsule    Refill:  0    Problem List Items Addressed This Visit    None    Visit Diagnoses    Need for MMR vaccine    -  Primary   Relevant Orders   Measles/Mumps/Rubella Immunity   Need for varicella vaccine       Relevant Orders   Varicella zoster antibody, IgG   Need for immunization against typhoid       Relevant Medications   typhoid (VIVOTIF) DR capsule   Need for vaccination       Relevant Orders    Ambulatory referral to [facility]   Influenza vaccine administered       Relevant Orders   Flu Vaccine QUAD 36+ mos IM (Fluarix & Fluzone Quad PF (Completed)   Need for hepatitis A and B vaccination       Relevant Orders   Hepatitis A hepatitis B combined vaccine IM (Completed)    will check titers for mmr and varicella Hep a/b #1 given -- rto 1 month for #2 and 6 m for #3 Flu shot also given  He wil go to the travel clinic for the others   Follow-up: Return if symptoms worsen or fail to improve.  Ann Held, DO

## 2018-01-26 NOTE — Patient Instructions (Signed)
TravelVaccine Information Vaccines, also called immunizations, can protect you from certain diseases. Vaccines can also prevent the spread of certain infections. It is important to see your health care provider or a travel medicine specialist 4-6 weeks before you travel. This allows time for recommended vaccines to take effect. It also provides enough time for you to get vaccines that must be given in a series over a period of days or weeks. Vaccines for travelers include:  Routine vaccines. These vaccines are standard.  Recommended travel vaccines. These vaccines are generally recommended before international travel.  Geographically required travel vaccines. These vaccines are necessary before travel to some countries or regions.  If it is less than 4 weeks before you leave, you should still see your health care provider. You might still benefit from vaccines or medicines. What are routine vaccines? Routine vaccines are shots that can protect you from common diseases in many parts of the world. Most routine vaccines are given at certain ages starting in childhood. Routine vaccines also include the annual flu (influenza) vaccine. It is important that you are up to date on your routine vaccines before you travel. You may be advised to get extra doses, also called booster vaccines, such as the Tdap (tetanus, diphtheria, and pertussis). What are recommended vaccines? Recommended travel vaccines change over time. Your health care provider can tell you what vaccines are recommended before your trip. The most common recommended vaccines before travel are hepatitis A and typhoid vaccines. Know your travel schedule when you visit your health care provider. The vaccines that are recommended before foreign travel will depend on several factors, such as:  The country or countries of travel.  Whether you will be traveling to rural areas.  How long you will be traveling.  The season of the year.  Your  age. Older adults should get a vaccine against a certain type of pneumonia (pneumococcal) and a vaccine against shingles (herpes zoster).  Your health status.  Your previous vaccines.  The annual influenza vaccine sometimes differs for the northern and southern hemispheres. You should:  Get both vaccines if you are traveling to the other hemisphere, and you have a chronic medical condition.  Get the vaccine shortly before or during the flu season, and only if the vaccine in your country differs from the vaccine in your destination country.  Get the other influenza vaccine either before leaving the country or shortly after arriving at the destination country.  What are geographically required vaccines? Children should be up to date with all of the recommended vaccinations. Parents should follow the standard vaccination guidelines that are recommended by the pediatrician. Some vaccines may be required during an ongoing outbreak of an infectious disease in a country or region. Your health care provider will be able to tell you about any outbreaks and required vaccines. Some examples of required vaccines include: Yellow fever vaccine  Proof of yellow fever immunization is currently required for most people before traveling to certain countries in [place] and [place].  If proof of immunization is incomplete or inaccurate, you could be quarantined, denied entry, or given another dose of vaccine at the travel site.  This vaccine can only be obtained at approved centers.  You should get the yellow fever vaccine at least 10 days before your trip.  After 10 days, most people show immunity to yellow fever.  If it has been longer than 10 years since you received the yellow fever vaccine, another dose is required.  Meningococcal vaccine    Meningococcal immunization may be required prior to travel to parts of [place] and [place].  Proof of meningococcal immunization is required by the  [place] [employer] for any person older than age 2 who is taking part in hajj or umrah.  Visas for traveling to take part in hajj or umrah will not even be issued until there is proof of immunization. You should get this vaccine at least 10 days before your trip.  After 10 days, most people show immunity.  If it has been longer than 3 years since your last immunization, another dose is required.  Some travel circumstances may require additional vaccination with the following vaccines:  Hepatitis B.  Rabies.  Tick-borne encephalitis.  Malaria.  Where to find more information:  Centers for Disease Control and Prevention (CDC): www.cdc.gov  World Health Organization (WHO): www.who.int This information is not intended to replace advice given to you by your health care provider. Make sure you discuss any questions you have with your health care provider. Document Released: 11/06/2009 Document Revised: 08/13/2016 Document Reviewed: 04/23/2016 Elsevier Interactive Patient Education  2018 Elsevier Inc.  

## 2018-01-27 LAB — MEASLES/MUMPS/RUBELLA IMMUNITY
Mumps IgG: <9 AU/mL — ABNORMAL LOW
RUBELLA: 1.88 {index}
Rubeola IgG: 132 AU/mL

## 2018-01-27 LAB — VARICELLA ZOSTER ANTIBODY, IGG: VARICELLA IGG: 1544 {index}

## 2018-01-28 ENCOUNTER — Telehealth: Payer: Self-pay

## 2018-01-28 NOTE — Telephone Encounter (Signed)
Per Dr. Nonda Lou result note, patient needs to come in for MMR booster. This is to be scheduled as a nurse visit, not a visit with provider. Left message to return call to schedule this visit. I have filled out vaccination order sheet at the office and placed on Dr. Ivy Lynn desk to be signed.

## 2018-02-05 ENCOUNTER — Ambulatory Visit: Payer: 59 | Admitting: Family Medicine

## 2018-02-05 ENCOUNTER — Ambulatory Visit (INDEPENDENT_AMBULATORY_CARE_PROVIDER_SITE_OTHER): Payer: 59

## 2018-02-05 DIAGNOSIS — Z23 Encounter for immunization: Secondary | ICD-10-CM | POA: Diagnosis not present

## 2018-06-09 DIAGNOSIS — N200 Calculus of kidney: Secondary | ICD-10-CM | POA: Diagnosis not present

## 2018-07-31 ENCOUNTER — Encounter: Payer: Self-pay | Admitting: Family Medicine

## 2018-07-31 ENCOUNTER — Ambulatory Visit (INDEPENDENT_AMBULATORY_CARE_PROVIDER_SITE_OTHER): Payer: 59 | Admitting: Family Medicine

## 2018-07-31 VITALS — BP 112/79 | HR 58 | Temp 98.0°F | Resp 16 | Ht 70.5 in | Wt 237.2 lb

## 2018-07-31 DIAGNOSIS — R946 Abnormal results of thyroid function studies: Secondary | ICD-10-CM

## 2018-07-31 DIAGNOSIS — Z1211 Encounter for screening for malignant neoplasm of colon: Secondary | ICD-10-CM

## 2018-07-31 DIAGNOSIS — Z125 Encounter for screening for malignant neoplasm of prostate: Secondary | ICD-10-CM

## 2018-07-31 DIAGNOSIS — Z0000 Encounter for general adult medical examination without abnormal findings: Secondary | ICD-10-CM

## 2018-07-31 DIAGNOSIS — Z23 Encounter for immunization: Secondary | ICD-10-CM | POA: Diagnosis not present

## 2018-07-31 LAB — COMPREHENSIVE METABOLIC PANEL
ALBUMIN: 4.7 g/dL (ref 3.5–5.2)
ALT: 23 U/L (ref 0–53)
AST: 17 U/L (ref 0–37)
Alkaline Phosphatase: 89 U/L (ref 39–117)
BUN: 17 mg/dL (ref 6–23)
CO2: 30 meq/L (ref 19–32)
CREATININE: 1.07 mg/dL (ref 0.40–1.50)
Calcium: 9.6 mg/dL (ref 8.4–10.5)
Chloride: 102 mEq/L (ref 96–112)
GFR: 79.63 mL/min (ref >60.00)
GLUCOSE: 85 mg/dL (ref 70–99)
Potassium: 4.2 mEq/L (ref 3.5–5.1)
SODIUM: 137 meq/L (ref 135–145)
Total Bilirubin: 1.7 mg/dL — ABNORMAL HIGH (ref 0.2–1.2)
Total Protein: 7.1 g/dL (ref 6.0–8.3)

## 2018-07-31 LAB — LIPID PANEL
CHOLESTEROL: 157 mg/dL (ref 0–200)
HDL: 39.7 mg/dL (ref >39.00)
LDL Cholesterol: 98 mg/dL (ref 0–99)
NonHDL: 116.95
TRIGLYCERIDES: 96 mg/dL (ref 0.0–149.0)
Total CHOL/HDL Ratio: 4
VLDL: 19.2 mg/dL (ref 0.0–40.0)

## 2018-07-31 LAB — CBC WITH DIFFERENTIAL/PLATELET
BASOS PCT: 1 % (ref 0.0–3.0)
Basophils Absolute: 0 10*3/uL (ref 0.0–0.1)
EOS ABS: 0.2 10*3/uL (ref 0.0–0.7)
Eosinophils Relative: 6 % — ABNORMAL HIGH (ref 0.0–5.0)
HCT: 46.6 % (ref 39.0–52.0)
Hemoglobin: 16.5 g/dL (ref 13.0–17.0)
LYMPHS ABS: 1.6 10*3/uL (ref 0.7–4.0)
Lymphocytes Relative: 43.9 % (ref 12.0–46.0)
MCHC: 35.3 g/dL (ref 30.0–36.0)
MCV: 87.8 fl (ref 78.0–100.0)
MONO ABS: 0.4 10*3/uL (ref 0.1–1.0)
Monocytes Relative: 10.5 % (ref 3.0–12.0)
NEUTROS ABS: 1.4 10*3/uL (ref 1.4–7.7)
Neutrophils Relative %: 38.6 % — ABNORMAL LOW (ref 43.0–77.0)
PLATELETS: 213 10*3/uL (ref 150.0–400.0)
RBC: 5.31 Mil/uL (ref 4.22–5.81)
RDW: 13.2 % (ref 11.5–15.5)
WBC: 3.7 10*3/uL — ABNORMAL LOW (ref 4.0–10.5)

## 2018-07-31 LAB — TSH: TSH: 4.81 u[IU]/mL — AB (ref 0.35–4.50)

## 2018-07-31 LAB — POC HEMOCCULT BLD/STL (OFFICE/1-CARD/DIAGNOSTIC)
FECAL OCCULT BLD: NEGATIVE
OCCULT BLOOD DATE: 8302019

## 2018-07-31 LAB — PSA: PSA: 0.19 ng/mL (ref 0.10–4.00)

## 2018-07-31 NOTE — Assessment & Plan Note (Signed)
ghm utd Check labs See AVS Hep a/b #2   rto 5 months for #3

## 2018-07-31 NOTE — Patient Instructions (Signed)

## 2018-07-31 NOTE — Addendum Note (Signed)
Addended by: Thelma BargeICHARDSON, Yosgar Demirjian D on: 07/31/2018 04:45 PM   Modules accepted: Orders

## 2018-07-31 NOTE — Progress Notes (Signed)
Patient ID: Scott Sanchez, male    DOB: 06-18-1976  Age: 42 y.o. MRN: [id number]    Subjective:  Subjective  HPI Scott Pilarhilip W Nguyen presents for cpe and labs.  No complaints  Review of Systems  Constitutional: Negative for chills and fever.  HENT: Negative for congestion and hearing loss.   Eyes: Negative for discharge.  Respiratory: Negative for cough and shortness of breath.   Cardiovascular: Negative for chest pain, palpitations and leg swelling.  Gastrointestinal: Negative for abdominal pain, blood in stool, constipation, diarrhea, nausea and vomiting.  Genitourinary: Negative for dysuria, frequency, hematuria and urgency.  Musculoskeletal: Negative for back pain and myalgias.  Skin: Negative for rash.  Allergic/Immunologic: Negative for environmental allergies.  Neurological: Negative for dizziness, weakness and headaches.  Hematological: Does not bruise/bleed easily.  Psychiatric/Behavioral: Negative for suicidal ideas. The patient is not nervous/anxious.     History Past Medical History:  Diagnosis Date  . History of kidney stones     He has a past surgical history that includes Kidney stone surgery (y-3); Lithotripsy (2008); and Extracorporeal shock wave lithotripsy (Left, 01/30/2017).   His family history includes Diabetes Mellitus II in his father; Hyperlipidemia in his father.He reports that he has never smoked. He has never used smokeless tobacco. He reports that he drinks alcohol. He reports that he does not use drugs.  No current outpatient medications on file prior to visit.   No current facility-administered medications on file prior to visit.      Objective:  Objective  Physical Exam  Constitutional: He is oriented to person, place, and time. He appears well-developed and well-nourished. No distress.  HENT:  Head: Normocephalic and atraumatic.  Right Ear: External ear normal.  Left Ear: External ear normal.  Nose: Nose normal.  Mouth/Throat: Oropharynx  is clear and moist. No oropharyngeal exudate.  Eyes: Pupils are equal, round, and reactive to light. Conjunctivae and EOM are normal. Right eye exhibits no discharge. Left eye exhibits no discharge.  Neck: Normal range of motion. Neck supple. No JVD present. No thyromegaly present.  Cardiovascular: Normal rate, regular rhythm and intact distal pulses.  No murmur heard. Pulmonary/Chest: Effort normal and breath sounds normal. No respiratory distress. He has no wheezes. He has no rales. He exhibits no tenderness.  Abdominal: Soft. Bowel sounds are normal. He exhibits no distension and no mass. There is no tenderness. There is no rebound and no guarding.  Genitourinary: Prostate normal and penis normal.  Musculoskeletal: Normal range of motion. He exhibits no edema or tenderness.  Lymphadenopathy:    He has no cervical adenopathy.  Neurological: He is alert and oriented to person, place, and time. He has normal reflexes. He displays normal reflexes. No cranial nerve deficit. He exhibits normal muscle tone.  Skin: Skin is warm and dry. No rash noted. He is not diaphoretic. No erythema.  Psychiatric: He has a normal mood and affect. His behavior is normal. Judgment and thought content normal.  Nursing note and vitals reviewed.  BP 112/79 (BP Location: Left Arm, Cuff Size: Normal)   Pulse (!) 58   Temp 98 F (36.7 C) (Oral)   Resp 16   Ht 5' 10.5" (1.791 m)   Wt 237 lb 3.2 oz (107.6 kg)   SpO2 98%   BMI 33.55 kg/m  Wt Readings from Last 3 Encounters:  07/31/18 237 lb 3.2 oz (107.6 kg)  01/26/18 242 lb 3.2 oz (109.9 kg)  09/18/17 235 lb (106.6 kg)     Lab Results  Component Value Date   WBC 3.5 (L) 09/18/2017   HGB 15.8 09/18/2017   HCT 43.4 09/18/2017   PLT 172 09/18/2017   GLUCOSE 72 (L) 09/18/2017   CHOL 137 07/25/2017   TRIG 149.0 07/25/2017   HDL 25.40 (L) 07/25/2017   LDLCALC 82 07/25/2017   ALT 58 (H) 09/18/2017   AST 36 09/18/2017   NA 141 09/18/2017   K 4.3 09/18/2017    CL 107 09/18/2017   CREATININE 1.2 09/18/2017   BUN 13 09/18/2017   CO2 29 09/18/2017   TSH 4.13 07/29/2017   PSA 0.26 07/25/2017   MICROALBUR 2.1 (H) 06/26/2015    No results found.   Assessment & Plan:  Plan  I have discontinued Khang Hannum. Buonomo's typhoid.  No orders of the defined types were placed in this encounter.   Problem List Items Addressed This Visit      Unprioritized   Preventative health care - Primary    ghm utd Check labs See AVS Hep a/b #2   rto 5 months for #3      Relevant Orders   Comprehensive metabolic panel   CBC with Differential/Platelet   Lipid panel   TSH   PSA    Other Visit Diagnoses    Need for hepatitis A and B vaccination       Relevant Orders   Hepatitis A hepatitis B combined vaccine IM (Completed)      Follow-up: Return in about 1 year (around 08/01/2019), or if symptoms worsen or fail to improve, for annual exam.  Donato Schultz, DO

## 2018-09-09 ENCOUNTER — Ambulatory Visit (INDEPENDENT_AMBULATORY_CARE_PROVIDER_SITE_OTHER): Payer: 59 | Admitting: Internal Medicine

## 2018-09-09 ENCOUNTER — Encounter: Payer: Self-pay | Admitting: Internal Medicine

## 2018-09-09 VITALS — BP 128/84 | HR 75 | Temp 98.0°F | Resp 16 | Ht 70.5 in | Wt 237.2 lb

## 2018-09-09 DIAGNOSIS — M545 Low back pain, unspecified: Secondary | ICD-10-CM

## 2018-09-09 MED ORDER — PREDNISONE 10 MG PO TABS: ORAL_TABLET | ORAL | 0 refills | Status: DC

## 2018-09-09 MED ORDER — CYCLOBENZAPRINE HCL 10 MG PO TABS: 10.0000 mg | ORAL_TABLET | Freq: Every evening | ORAL | 0 refills | Status: DC | PRN

## 2018-09-09 NOTE — Patient Instructions (Addendum)
Rest  ICE-warm compress  Prednisone as prescribed  Tylenol  500 mg OTC 2 tabs a day every 8 hours as needed for pain   IBUPROFEN (Advil or Motrin) 200 mg 2 tablets every 6 hours as needed for pain.  Always take it with food because may cause gastritis and ulcers.  If you notice nausea, stomach pain, change in the color of stools --->  Stop the medicine and let us know  Flexeril at night  Call if no gradually better   PT once better for prevention, please call PCP

## 2018-09-09 NOTE — Progress Notes (Signed)
Pre visit review using our clinic review tool, if applicable. No additional management support is needed unless otherwise documented below in the visit note. 

## 2018-09-09 NOTE — Progress Notes (Signed)
Subjective:    Patient ID: Scott Sanchez, male    DOB: Jul 08, 1976, 42 y.o.   MRN: [id number]  DOS:  09/09/2018 Type of visit - description : acute, here w/  Wife  Interval history: Sx started 2 days ago with severe left-sided low back pain with radiation to the posterior aspect of the left leg almost to the knee. Symptoms  started after he simply bent to pick up his bag which was not heavy. After that he could "hardly walk" and he had to crawl back home. Pain increases when he bends his back, but he moves his hips without problems. Taking Tylenol Today for the first time pain is slightly better.   Review of Systems Denies fever chills No injury or fall No paresthesias or numbness.  Past Medical History:  Diagnosis Date  . History of kidney stones     Past Surgical History:  Procedure Laterality Date  . EXTRACORPOREAL SHOCK WAVE LITHOTRIPSY Left 01/30/2017   Procedure: LEFT EXTRACORPOREAL SHOCK WAVE LITHOTRIPSY (ESWL);  Surgeon: Sebastian Ache, MD;  Location: [facility];  Service: Urology;  Laterality: Left;  . KIDNEY STONE SURGERY  y-3  . LITHOTRIPSY  2008    Social History   Socioeconomic History  . Marital status: Married    Spouse name: Not on file  . Number of children: Not on file  . Years of education: Not on file  . Highest education level: Not on file  Occupational History  . Occupation: Advertising account planner  Social Needs  . Financial resource strain: Not on file  . Food insecurity:    Worry: Not on file    Inability: Not on file  . Transportation needs:    Medical: Not on file    Non-medical: Not on file  Tobacco Use  . Smoking status: Never Smoker  . Smokeless tobacco: Never Used  Substance and Sexual Activity  . Alcohol use: Yes    Comment: rarely  . Drug use: No  . Sexual activity: Yes    Partners: Female  Lifestyle  . Physical activity:    Days per week: Not on file    Minutes per session: Not on file  . Stress: Not on file  Relationships  .  Social connections:    Talks on phone: Not on file    Gets together: Not on file    Attends religious service: Not on file    Active member of club or organization: Not on file    Attends meetings of clubs or organizations: Not on file    Relationship status: Not on file  . Intimate partner violence:    Fear of current or ex partner: Not on file    Emotionally abused: Not on file    Physically abused: Not on file    Forced sexual activity: Not on file  Other Topics Concern  . Not on file  Social History Narrative   Exercise-- no      Allergies as of 09/09/2018   No Known Allergies     Medication List    as of 09/09/2018  3:19 PM   You have not been prescribed any medications.        Objective:   Physical Exam BP 128/84 (BP Location: Left Arm, Patient Position: Sitting, Cuff Size: Normal)   Pulse 75   Temp 98 F (36.7 C) (Oral)   Resp 16   Ht 5' 10.5" (1.791 m)   Wt 237 lb 4 oz (107.6 kg)   SpO2  97%   BMI 33.56 kg/m  General:   Well developed, NAD, see BMI.  HEENT:  Normocephalic . Face symmetric, atraumatic  MSK: No TTP at the lumbosacral spine or SI joints. Hip rotation normal. Skin: Not pale. Not jaundice Neurologic:  alert & oriented X3.  Speech normal, gait and posture mild to moderately antalgic.  Gait is unassisted DTR symmetric. Straight leg test question of positive on the left. Motor symmetric  Psych--  Cognition and judgment appear intact.  Cooperative with normal attention span and concentration.  Behavior appropriate. No anxious or depressed appearing.      Assessment & Plan:   Acute lumbalgia ? radiculopathy. Recommend treatment with rest, heat/cold, round of prednisone, Tylenol. After prednisone is gone, okay to take ibuprofen. Flexeril only at night to due to drowsiness. Call if not gradually better Consider PT after acute episode resolves for prevention.

## 2018-11-28 IMAGING — CR DG ABDOMEN 1V
2 series · 2 of 2 positions shown · non-contrast
Comparison: KUB of 01/03/2017

CLINICAL DATA: Preop for kidney stone on the left

EXAM:
ABDOMEN - 1 VIEW

[t abdomen supine (1 of 2)]
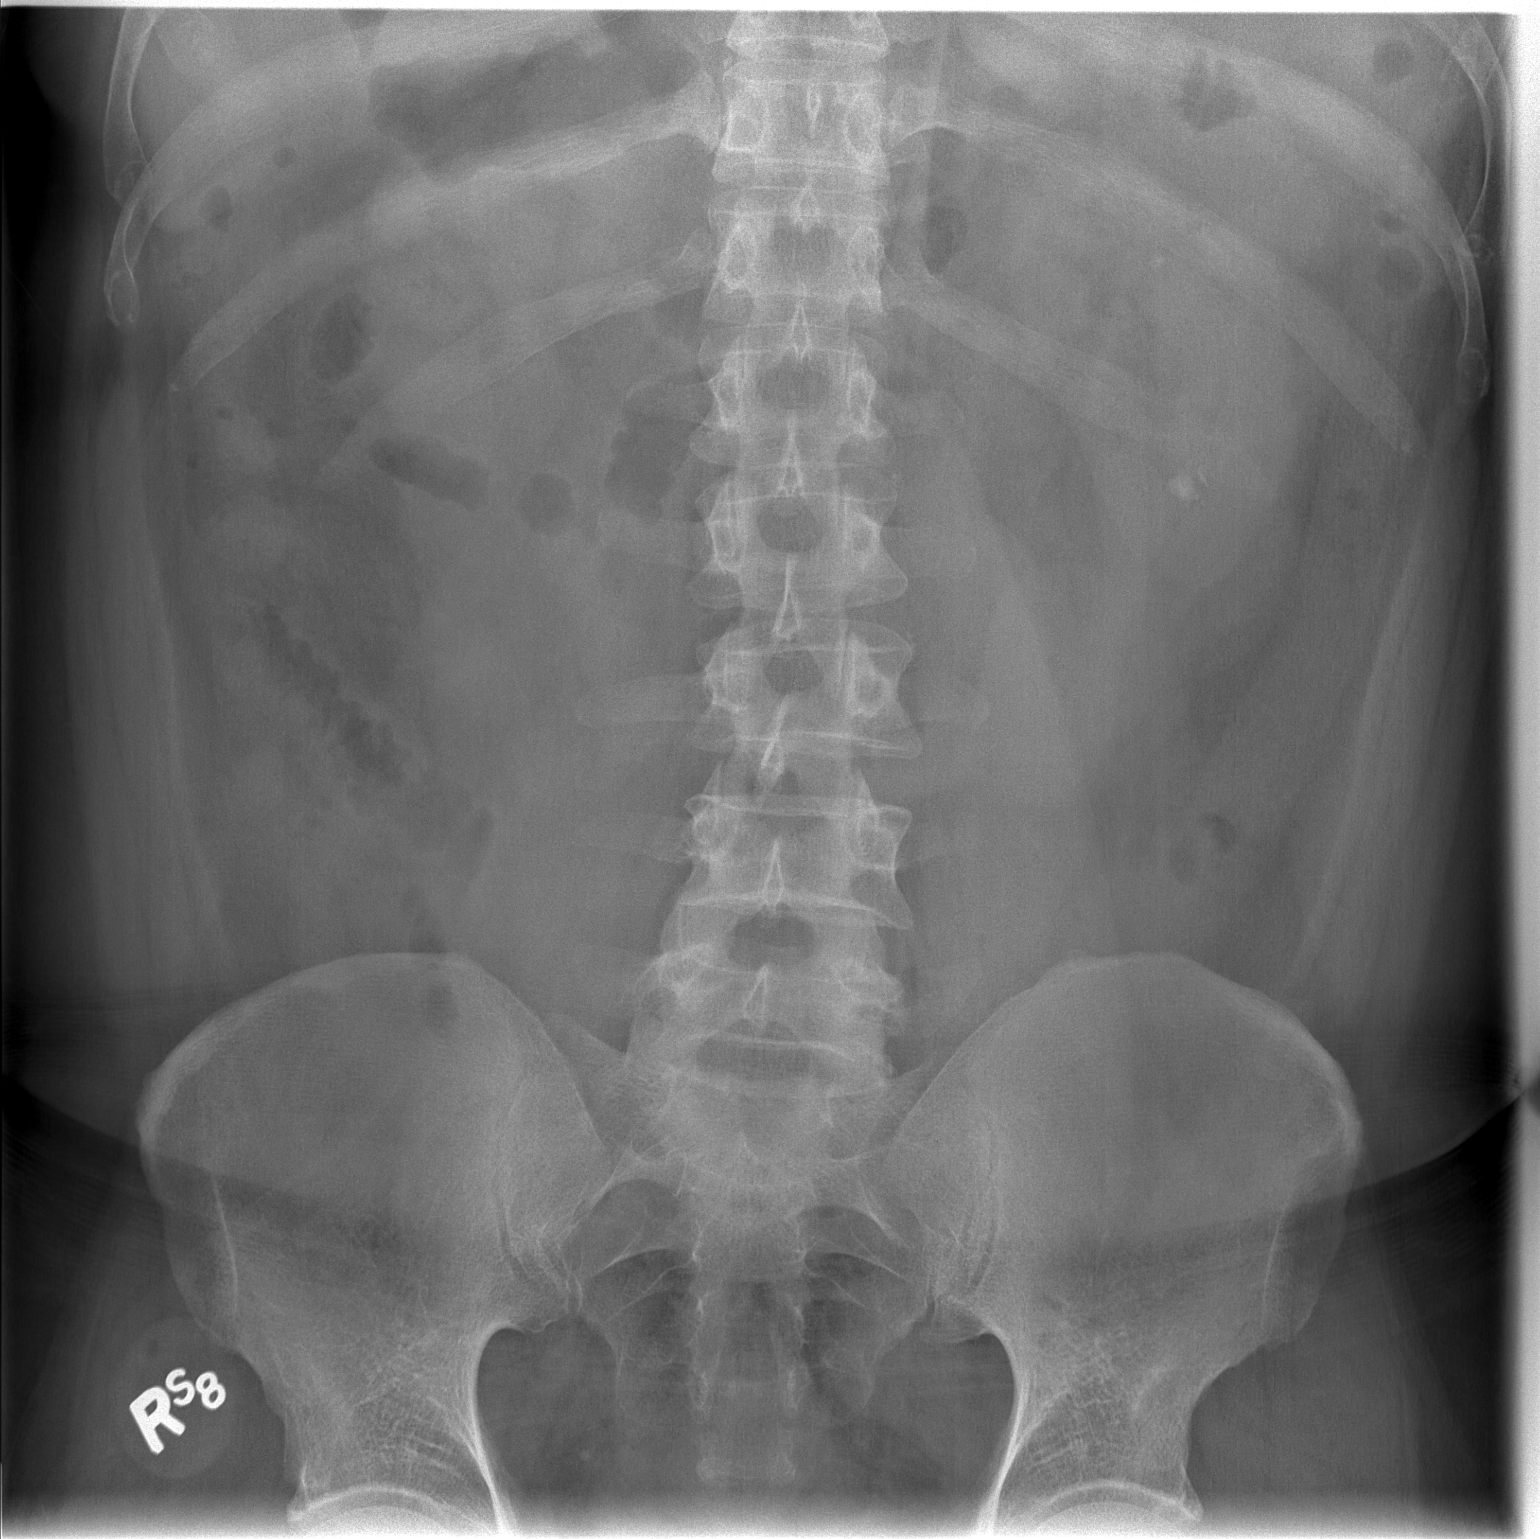

[t abdomen supine (2 of 2)]
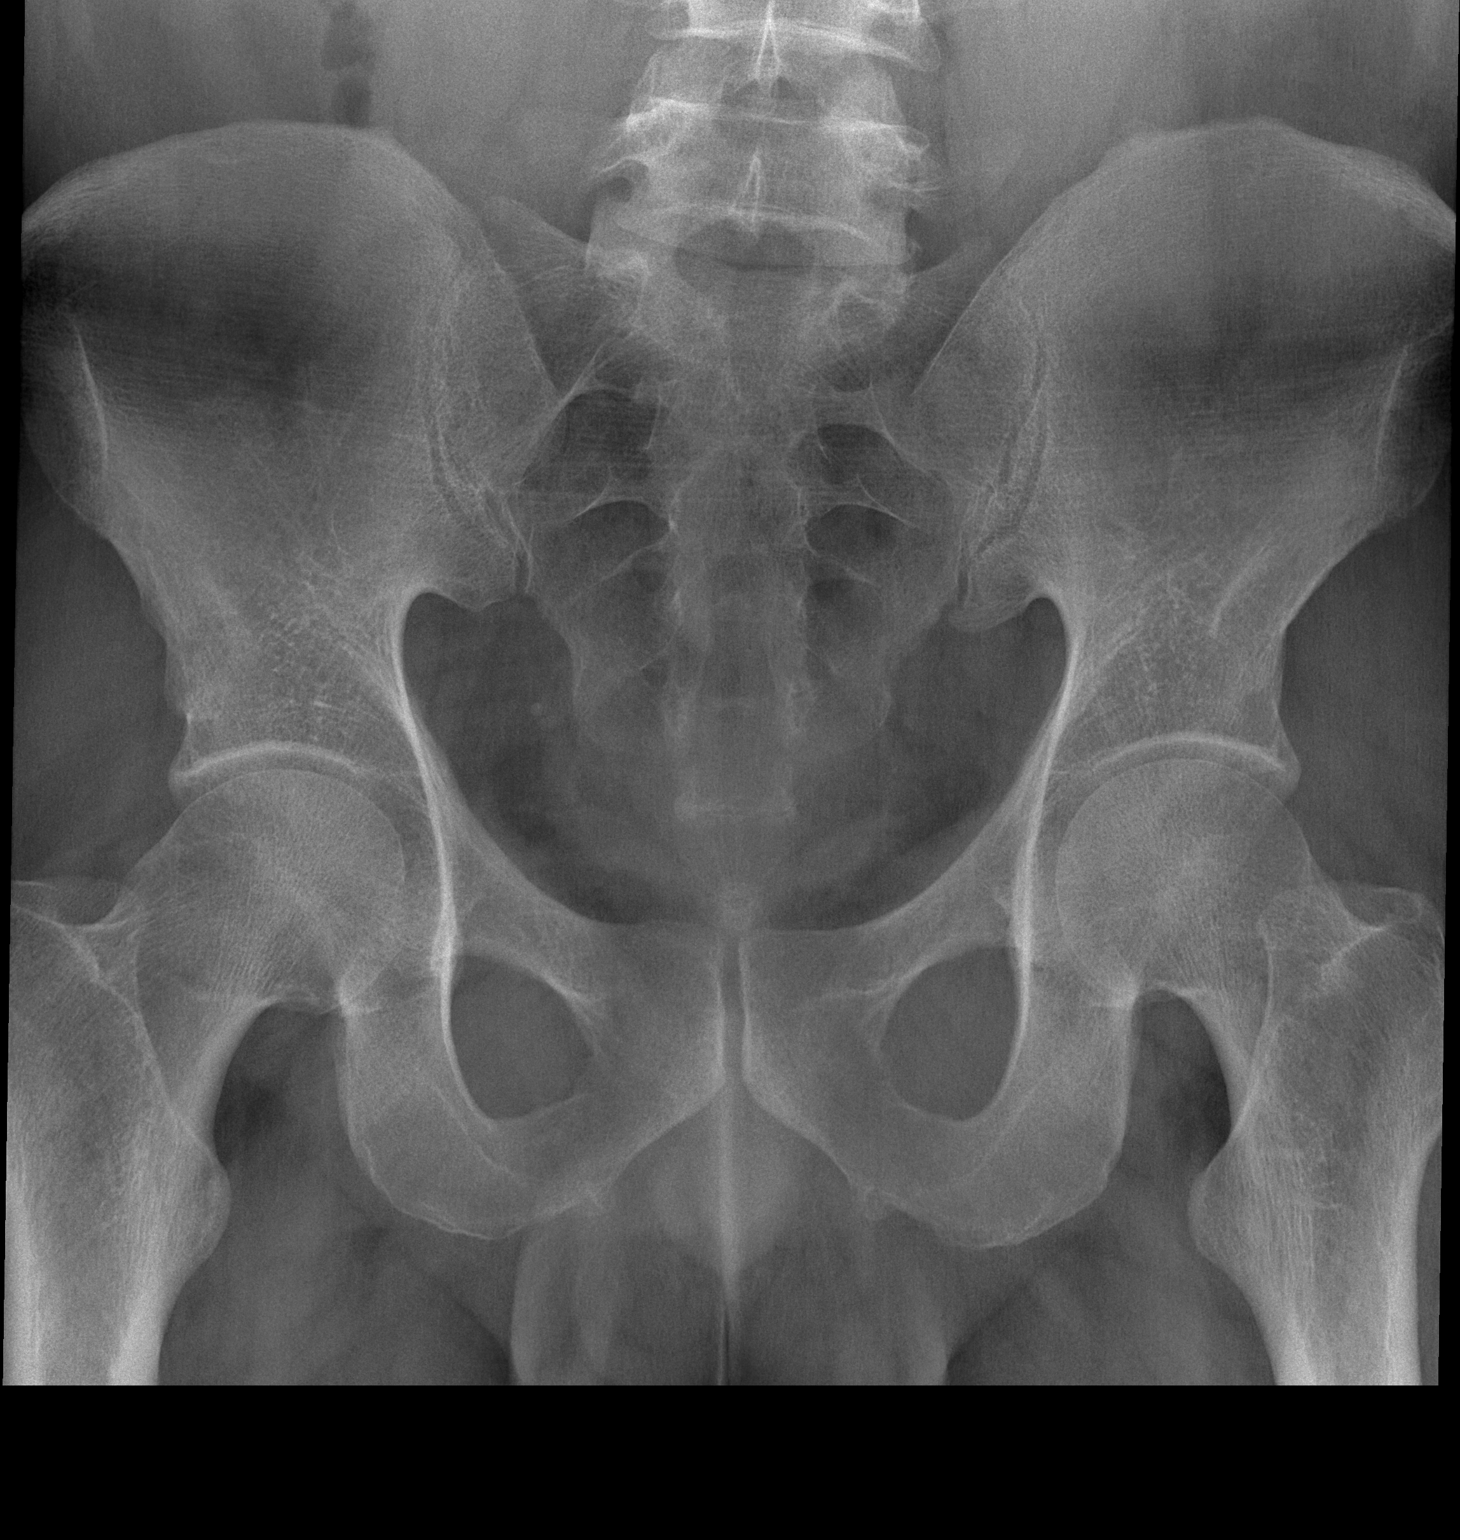

[2 of 2 positions shown; findings below may reference images not displayed]

FINDINGS: There is no change in left renal calculi the largest stone in the
lower pole. No right renal calculi are seen. No calcifications are
noted along the expected courses of the ureters.
IMPRESSION: No change in left renal calculi, the largest in the lower pole.

## 2018-12-29 ENCOUNTER — Ambulatory Visit (INDEPENDENT_AMBULATORY_CARE_PROVIDER_SITE_OTHER): Payer: 59 | Admitting: Internal Medicine

## 2018-12-29 ENCOUNTER — Encounter: Payer: Self-pay | Admitting: Internal Medicine

## 2018-12-29 VITALS — BP 126/64 | HR 69 | Temp 98.2°F | Resp 16 | Ht 70.5 in | Wt 237.0 lb

## 2018-12-29 DIAGNOSIS — J40 Bronchitis, not specified as acute or chronic: Secondary | ICD-10-CM | POA: Diagnosis not present

## 2018-12-29 MED ORDER — AZITHROMYCIN 250 MG PO TABS: ORAL_TABLET | ORAL | 0 refills | Status: DC

## 2018-12-29 MED ORDER — HYDROCODONE-HOMATROPINE 5-1.5 MG/5ML PO SYRP: 5.0000 mL | ORAL_SOLUTION | Freq: Every evening | ORAL | 0 refills | Status: DC | PRN

## 2018-12-29 NOTE — Progress Notes (Signed)
Pre visit review using our clinic review tool, if applicable. No additional management support is needed unless otherwise documented below in the visit note. 

## 2018-12-29 NOTE — Progress Notes (Signed)
Subjective:    Patient ID: Scott Sanchez, male    DOB: 30-Aug-1976, 43 y.o.   MRN: [id number]  DOS:  12/29/2018 Type of visit - description: Acute Symptoms started approximately a week ago, had cough with green sputum production, subsequently ear discomfort and pressure and nasal congestion. Unable to sleep due to cough. Some sore throat Taking multiple OTCs: DayQuil, NyQuil, Tylenol etc. No history of tobacco or asthma   Review of Systems + Subjective fever. No myalgias.  No nausea or vomiting He did have some greenish nasal discharge which is not better  Past Medical History:  Diagnosis Date  . History of kidney stones     Past Surgical History:  Procedure Laterality Date  . EXTRACORPOREAL SHOCK WAVE LITHOTRIPSY Left 01/30/2017   Procedure: LEFT EXTRACORPOREAL SHOCK WAVE LITHOTRIPSY (ESWL);  Surgeon: Sebastian Ache, MD;  Location: [facility];  Service: Urology;  Laterality: Left;  . KIDNEY STONE SURGERY  y-3  . LITHOTRIPSY  2008    Social History   Socioeconomic History  . Marital status: Married    Spouse name: Not on file  . Number of children: Not on file  . Years of education: Not on file  . Highest education level: Not on file  Occupational History  . Occupation: Advertising account planner  Social Needs  . Financial resource strain: Not on file  . Food insecurity:    Worry: Not on file    Inability: Not on file  . Transportation needs:    Medical: Not on file    Non-medical: Not on file  Tobacco Use  . Smoking status: Never Smoker  . Smokeless tobacco: Never Used  Substance and Sexual Activity  . Alcohol use: Yes    Comment: rarely  . Drug use: No  . Sexual activity: Yes    Partners: Female  Lifestyle  . Physical activity:    Days per week: Not on file    Minutes per session: Not on file  . Stress: Not on file  Relationships  . Social connections:    Talks on phone: Not on file    Gets together: Not on file    Attends religious service: Not on file   Active member of club or organization: Not on file    Attends meetings of clubs or organizations: Not on file    Relationship status: Not on file  . Intimate partner violence:    Fear of current or ex partner: Not on file    Emotionally abused: Not on file    Physically abused: Not on file    Forced sexual activity: Not on file  Other Topics Concern  . Not on file  Social History Narrative   Exercise-- no      Allergies as of 12/29/2018   No Known Allergies     Medication List       Accurate as of December 29, 2018 11:22 AM. Always use your most recent med list.        cyclobenzaprine 10 MG tablet Commonly known as:  FLEXERIL Take 1 tablet (10 mg total) by mouth at bedtime as needed for muscle spasms.   predniSONE 10 MG tablet Commonly known as:  DELTASONE 5 tablets  X 2 days, 4 tablets x 2 days, 3 tabs x 2 days, 2 tabs x 2 days, 1 tab x 2 days           Objective:   Physical Exam BP 126/64 (BP Location: Left Arm, Patient Position: Sitting, Cuff  Size: Normal)   Pulse 69   Temp 98.2 F (36.8 C) (Oral)   Resp 16   Ht 5' 10.5" (1.791 m)   Wt 237 lb (107.5 kg)   SpO2 96%   BMI 33.53 kg/m  General:   Well developed, NAD but frequent cough noted, BMI noted. HEENT:  Normocephalic . Face symmetric, atraumatic. TMs: Bulged, no red.  Nose quite congested, sinuses no TTP.  Throat symmetric not red. Lungs:  Few rhonchi and large airway congestion mostly with cough.  No wheezing, no crackles. Normal respiratory effort, no intercostal retractions, no accessory muscle use. Heart: RRR,  no murmur.  No pretibial edema bilaterally  Skin: Not pale. Not jaundice Neurologic:  alert & oriented X3.  Speech normal, gait appropriate for age and unassisted Psych--  Cognition and judgment appear intact.  Cooperative with normal attention span and concentration.  Behavior appropriate. No anxious or depressed appearing.      Assessment    43 year old gentleman, PMH includes  kidney stones, presents with Bronchitis: Symptoms consistent with bronchitis and mild sinusitis: recommend rest, fluids, Tylenol, Mucinex DM, low-dose decongestants. He also has severe cough at night, low-dose of hydrocodone prescribed. If he is not better in few days, will start Zithromax. Multiple questions answered to the best of my ability regards bronchitis, rationale for treatment without antibiotics and use them only if no better

## 2018-12-29 NOTE — Patient Instructions (Signed)
Rest, fluids , tylenol  For cough:  --Take Mucinex DM  as needed until better  (guaifenasin and dextromethorphan ) --If sever cough at night take hydrocodone (will cause drowsiness) --Get pseudoephedrine 30 mg (behind the counter, you need to talk with the pharmacist) take one tablet 3 or 4 times a day as needed for congestion  For nasal congestion: Use OTC   Flonase : 2 nasal sprays on each side of the nose in the morning until you feel better    Take the antibiotic as prescribed , zithromax if no better in 3 to 4 days   Call if not gradually better over the next  10 days  Call anytime if the symptoms are severe

## 2019-01-21 ENCOUNTER — Encounter: Payer: Self-pay | Admitting: Family Medicine

## 2019-01-21 ENCOUNTER — Encounter (HOSPITAL_COMMUNITY): Payer: Self-pay

## 2019-01-21 ENCOUNTER — Other Ambulatory Visit: Payer: Self-pay

## 2019-01-21 ENCOUNTER — Ambulatory Visit: Payer: 59 | Admitting: Family Medicine

## 2019-01-21 ENCOUNTER — Ambulatory Visit (INDEPENDENT_AMBULATORY_CARE_PROVIDER_SITE_OTHER): Payer: 59 | Admitting: Family Medicine

## 2019-01-21 ENCOUNTER — Inpatient Hospital Stay (HOSPITAL_COMMUNITY)
Admission: RE | Admit: 2019-01-21 | Discharge: 2019-01-25 | DRG: 885 | Disposition: A | Discharge status: Home / Self Care | Payer: 59 | Attending: Psychiatry | Admitting: Psychiatry

## 2019-01-21 VITALS — BP 108/78 | HR 95 | Temp 98.0°F | Resp 16 | Ht 71.0 in | Wt 226.0 lb

## 2019-01-21 DIAGNOSIS — R45851 Suicidal ideations: Secondary | ICD-10-CM | POA: Diagnosis present

## 2019-01-21 DIAGNOSIS — Z8349 Family history of other endocrine, nutritional and metabolic diseases: Secondary | ICD-10-CM | POA: Diagnosis not present

## 2019-01-21 DIAGNOSIS — G4700 Insomnia, unspecified: Secondary | ICD-10-CM | POA: Diagnosis present

## 2019-01-21 DIAGNOSIS — Z833 Family history of diabetes mellitus: Secondary | ICD-10-CM

## 2019-01-21 DIAGNOSIS — R4689 Other symptoms and signs involving appearance and behavior: Secondary | ICD-10-CM | POA: Diagnosis not present

## 2019-01-21 DIAGNOSIS — F332 Major depressive disorder, recurrent severe without psychotic features: Secondary | ICD-10-CM | POA: Diagnosis not present

## 2019-01-21 DIAGNOSIS — Z566 Other physical and mental strain related to work: Secondary | ICD-10-CM | POA: Diagnosis not present

## 2019-01-21 DIAGNOSIS — F329 Major depressive disorder, single episode, unspecified: Secondary | ICD-10-CM | POA: Diagnosis present

## 2019-01-21 DIAGNOSIS — R4589 Other symptoms and signs involving emotional state: Secondary | ICD-10-CM | POA: Insufficient documentation

## 2019-01-21 DIAGNOSIS — F322 Major depressive disorder, single episode, severe without psychotic features: Principal | ICD-10-CM | POA: Diagnosis present

## 2019-01-21 DIAGNOSIS — Z87442 Personal history of urinary calculi: Secondary | ICD-10-CM | POA: Diagnosis not present

## 2019-01-21 DIAGNOSIS — F419 Anxiety disorder, unspecified: Secondary | ICD-10-CM | POA: Diagnosis present

## 2019-01-21 MED ORDER — ENSURE ENLIVE PO LIQD: 237.0000 mL | Freq: Two times a day (BID) | ORAL | Status: DC

## 2019-01-21 MED ORDER — LORAZEPAM 0.5 MG PO TABS
0.5000 mg | ORAL_TABLET | Freq: Four times a day (QID) | ORAL | Status: DC | PRN
  Administered 2019-01-21 – 2019-01-24 (×6): 0.5 mg via ORAL
  Filled 2019-01-21 (×5): qty 1

## 2019-01-21 MED ORDER — MAGNESIUM HYDROXIDE 400 MG/5ML PO SUSP: 30.0000 mL | Freq: Every day | ORAL | Status: DC | PRN

## 2019-01-21 MED ORDER — ALUM & MAG HYDROXIDE-SIMETH 200-200-20 MG/5ML PO SUSP: 30.0000 mL | ORAL | Status: DC | PRN

## 2019-01-21 MED ORDER — TRAZODONE HCL 50 MG PO TABS
50.0000 mg | ORAL_TABLET | Freq: Every evening | ORAL | Status: DC | PRN
  Administered 2019-01-21 – 2019-01-24 (×4): 50 mg via ORAL
  Filled 2019-01-21 (×4): qty 1

## 2019-01-21 MED ORDER — ESCITALOPRAM OXALATE 5 MG PO TABS
5.0000 mg | ORAL_TABLET | Freq: Every day | ORAL | Status: DC
  Administered 2019-01-21 – 2019-01-22 (×2): 5 mg via ORAL
  Filled 2019-01-21 (×5): qty 1

## 2019-01-21 MED ORDER — ACETAMINOPHEN 325 MG PO TABS: 650.0000 mg | ORAL_TABLET | Freq: Four times a day (QID) | ORAL | Status: DC | PRN

## 2019-01-21 MED ORDER — HYDROXYZINE HCL 25 MG PO TABS: 25.0000 mg | ORAL_TABLET | Freq: Three times a day (TID) | ORAL | Status: DC | PRN

## 2019-01-21 NOTE — BHH Suicide Risk Assessment (Signed)
[facility] Admission Suicide Risk Assessment   Nursing information obtained from:  Patient Demographic factors:  Male, Caucasian Current Mental Status:  Suicidal ideation indicated by patient, Self-harm thoughts, Suicidal ideation indicated by others Loss Factors:  NA Historical Factors:  Impulsivity Risk Reduction Factors:  Responsible for children under 45 years of age, Sense of responsibility to family, Employed, Living with another person, especially a relative, Positive social support, Positive therapeutic relationship, Positive coping skills or problem solving skills  Total Time spent with patient: 45 minutes Principal Problem:  MDD Diagnosis:  Active Problems:   MDD (major depressive disorder)  Subjective Data:   Continued Clinical Symptoms:  Alcohol Use Disorder Identification Test Final Score (AUDIT): 0 The "Alcohol Use Disorders Identification Test", Guidelines for Use in Primary Care, Second Edition.  World Science writer [facility]). Score between 0-7:  no or low risk or alcohol related problems. Score between 8-15:  moderate risk of alcohol related problems. Score between 16-19:  high risk of alcohol related problems. Score 20 or above:  warrants further diagnostic evaluation for alcohol dependence and treatment.   CLINICAL FACTORS:  43 year old male, married, 2 children, employed as an Art gallery manager.  Presents for worsening depression, neurovegetative symptoms of depression, significant anxiety, recent suicidal ideations with thoughts of crashing car, episodes of head-banging.  He reports she has been under a significant amount of stress related work issues-specifically to an important work related project he is heading, and which has an International aid/development worker.  He denies prior psychiatric admissions/ treatment .    Psychiatric Specialty Exam: Physical Exam  ROS  Blood pressure 106/79, pulse 65, temperature 97.9 F (36.6 C), temperature source Oral, resp. rate 18, height 5\' 11"   (1.803 m), weight 100.7 kg, SpO2 99 %.Body mass index is 30.96 kg/m.  See admit note MSE   COGNITIVE FEATURES THAT CONTRIBUTE TO RISK:  Closed-mindedness and Loss of executive function    SUICIDE RISK:   Moderate:  Frequent suicidal ideation with limited intensity, and duration, some specificity in terms of plans, no associated intent, good self-control, limited dysphoria/symptomatology, some risk factors present, and identifiable protective factors, including available and accessible social support.  PLAN OF CARE: Patient will be admitted to inpatient psychiatric unit for stabilization and safety. Will provide and encourage milieu participation. Provide medication management and maked adjustments as needed.  Will follow daily.    I certify that inpatient services furnished can reasonably be expected to improve the patient's condition.   Craige Cotta, MD 01/21/2019, 5:16 PM

## 2019-01-21 NOTE — Progress Notes (Signed)
Patient ID: Scott Sanchez, male   DOB: 1976-09-28, 43 y.o.   MRN: [id number]  Admission note  Pt is a 43 yo male that presents voluntarily on 01/21/2019 as a walk in with worsening anxiety that leads to depression. 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 This leads to more anxiety, which causes him to become depressed with anhedonia following. Pt denies any pain on admission. Pt has only a hx of kidney stones. Pt denies tobacco/alcohol/drug/Rx abuse/use. Pt has a PCP who referred him to [place] after he told her yesterday that he was feeling suicidal with only thoughts, no plan. Pt states he went through an intersection and thought he could have just died, but that made him think of his wife and family. This made him reevaluate. Pt states his wife is biggest support system. Pt denies any present/past physical/verbal/sexual abuse. Pt states he wants to work on coping mechanisms and med management, as he hasn't felt that taking a walk has been working for him. Pt is expected to be discharge back home after discharge.   Consents signed, skin/belongings search completed and patient oriented to unit. Patient stable at this time. Patient given the opportunity to express concerns and ask questions. Patient given toiletries. Will continue to monitor.

## 2019-01-21 NOTE — Progress Notes (Signed)
Pt attended wrap-up group tonight. Pt appears anxious in affect and mood. Pt denies SI/HI/AVH/Pain at this time. Pt states he is unsure of whether he needs to be here. Pt endorsing racing thoughts, anxiety, insomnia, and poor-concentration. Pt was offered ativan and trazodone; Pt accepted. Print-out of meds requested and given. Pt sign 72 hr request to be d/c on 01/21/2019 @ 2111. Pt states he has a great support system. Pt states his inability to concentrate impedes his productivity at work. Support offered. Will continue with POC.

## 2019-01-21 NOTE — Assessment & Plan Note (Signed)
D/w [facility]  Pt wife to take him to [facility] for evaluation

## 2019-01-21 NOTE — Tx Team (Signed)
Initial Treatment Plan 01/21/2019 4:16 PM [name]    PATIENT STRESSORS: Occupational concerns   PATIENT STRENGTHS: Ability for insight Average or above average intelligence Capable of independent living Communication skills Financial means Motivation for treatment/growth Physical Health Supportive family/friends Work skills   PATIENT IDENTIFIED PROBLEMS: "work on Pharmacologist for anxiety"  "get medication to help with my anxiety/depression"                   DISCHARGE CRITERIA:  Ability to meet basic life and health needs Improved stabilization in mood, thinking, and/or behavior Medical problems require only outpatient monitoring Motivation to continue treatment in a less acute level of care  PRELIMINARY DISCHARGE PLAN: Return to previous living arrangement  PATIENT/FAMILY INVOLVEMENT: This treatment plan has been presented to and reviewed with the patient, Scott Sanchez.  The patient and family have been given the opportunity to ask questions and make suggestions.  Raylene Miyamoto, RN 01/21/2019, 4:16 PM

## 2019-01-21 NOTE — H&P (Signed)
Behavioral Health Medical Screening Exam  Scott Sanchez is a 43 y.o. male married, Caucasian male with no prior hx of inpatient psychiatric admissions. Have had hx of mental health treatments about 30 years ago after his parent's divorce. He came to the [facility] as a walk-in today with complaints of worsening symptoms of depression (high anxiety levels, panic symptoms, feeling of dread, insomnia, self isolation, racing thoughts, 11 pounds wt loss & inability to cope). 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 He says his work load & responsibilities are overwhelming that he could not keep-up. He says his co-workers are noticing his concerns & it worries him that he may lose his job. He says although not actively suicidal or planing to hurt himeself, he wishes at times that he will just die in a car wreck & all these will be over. Although, having these passive suicidal thoughts, these much worsening symptoms of depression/anxiety, patient is hesitant about coming inpatient. He is worried about what his co-workers would think of or say about him. Scott Sanchez has been informed that he will benefit from inpatient hospitalization for medication management & some group counseling sessions. Will recommend inpatient hospitalization for this patient.  Total Time spent with patient: 30 minutes  Psychiatric Specialty Exam: Physical Exam  Nursing note and vitals reviewed. Constitutional: He appears well-developed.  HENT:  Head: Normocephalic.  Eyes: Pupils are equal, round, and reactive to light.  Neck: Normal range of motion.  Cardiovascular: Normal rate.  Respiratory: Effort normal.  Genitourinary:    Genitourinary Comments: Deferred   Musculoskeletal: Normal range of motion.  Neurological: He is alert.  Skin: Skin is warm.    Review of Systems  Constitutional: Positive for weight loss.  HENT:  Negative.   Eyes: Negative.   Respiratory: Negative.  Negative for cough and shortness of breath.   Cardiovascular: Negative.  Negative for chest pain and palpitations.  Gastrointestinal: Negative.  Negative for abdominal pain, heartburn, nausea and vomiting.  Genitourinary: Negative.   Musculoskeletal: Negative.   Skin: Negative.   Neurological: Negative.  Negative for dizziness and headaches.  Endo/Heme/Allergies: Negative.   Psychiatric/Behavioral: Positive for depression and suicidal ideas (Passive, denies any plans or intent to hurt himself). Negative for hallucinations, memory loss and substance abuse. The patient is nervous/anxious (reports frequent panic attacks) and has insomnia.     Blood pressure 106/79, pulse 65, temperature 97.9 F (36.6 C), temperature source Oral, resp. rate 18, SpO2 99 %.There is no height or weight on file to calculate BMI.  General Appearance: Casual, Guarded and Well Groomed  Eye Contact:  Good  Speech:  Clear and Coherent and Slow  Volume:  Normal  Mood:  Anxious, Depressed and worried  Affect:  Congruent and Flat  Thought Process:  Coherent, Goal Directed and Descriptions of Associations: Intact  Orientation:  Full (Time, Place, and Person)  Thought Content:  Rumination  Suicidal Thoughts:  Yes.  without intent/plan  Homicidal Thoughts:  Denies  Memory:  Immediate;   Good Recent;   Good Remote;   Good  Judgement:  Fair  Insight:  Present  Psychomotor Activity:  Decreased  Concentration: Concentration: Fair and Attention Span: Fair  Recall:  Good  Fund of Knowledge:Good  Language: Good  Akathisia:  No  Handed:  Right  AIMS (if indicated):     Assets:  Communication Skills Desire for Improvement Physical Health Social Support  Sleep: NA   Musculoskeletal: Strength & Muscle Tone: within normal limits Gait & Station: normal Patient leans: N/A  Blood pressure 106/79, pulse 65, temperature 97.9 F (36.6 C), temperature source Oral,  resp. rate 18, SpO2 99 %.  Recommendations: Because of the chronic nature of his worsening symptoms of depression & the associated work related stressors, this patient is being recommended for inpatient psychiatric admission for further evaluation & treatment of his symptoms.  Based on my evaluation the patient does not appear to have an emergency medical condition.  Scott Stammer, NP, PMHNP, FNP-BC 01/21/2019, 1:56 PM

## 2019-01-21 NOTE — H&P (Addendum)
Psychiatric Admission Assessment Adult  Patient Identification: Scott Sanchez MRN:  [name]68 Date of Evaluation:  01/21/2019 Chief Complaint:  " I felt I needed to get help before things got even worse" Principal Diagnosis:  MDD Diagnosis:  Active Problems:   MDD (major depressive disorder)  History of Present Illness:  43 year old male , presented to the hospital voluntarily, at the encouragement of his PCP.  States he has been feeling anxious and depressed over the last few weeks, which he attributes to significant stressors, mainly work related . 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 States " the pressure has been so much, I find myself withdrawing more than engaging ". Over the last week he has been experiencing some suicidal ideations, with thoughts of crashing his vehicle.  He also has had episodes where he has banged head on a wall due to feeling intensely anxious. Endorses neuro-vegetative symptoms as below. States insomnia has been a major issue and states " even though I go to bed at 8 pm every night, I am getting 3 hours of sleep".  In addition to neuro-vegetative symptoms also describes increased anxiety and frequent panic symptoms.   Associated Signs/Symptoms: Depression Symptoms:  depressed mood, anhedonia, insomnia, suicidal thoughts with specific plan, anxiety, loss of energy/fatigue, decreased appetite, (Hypo) Manic Symptoms:  None noted or endorsed . Anxiety Symptoms:  Describes increased anxiety recently , has had panic attacks. Psychotic Symptoms: denies  PTSD Symptoms: Does not endorse  Total Time spent with patient: 45 minutes  Past Psychiatric History: no history of prior psychiatric admissions,no history of suicide attempts, denies history of self cutting, describes history of prior depressive episodes , but not as severe as current . No history of psychosis, no history of  mania or hypomania, no history of PTSD,  Denies prior history of panic disorder, does endorse history of worrying excessively at times . Denies social anxiety. Denies agoraphobia.  Denies history of violence  Is the patient at risk to self? Yes.    Has the patient been a risk to self in the past 6 months? No.  Has the patient been a risk to self within the distant past? No.  Is the patient a risk to others? No.  Has the patient been a risk to others in the past 6 months? No.  Has the patient been a risk to others within the distant past? No.   Prior Inpatient Therapy: Prior Inpatient Therapy: No Prior Outpatient Therapy: Prior Outpatient Therapy: No Does patient have an ACCT team?: No Does patient have Intensive In-House Services?  : No Does patient have [facility] services? : No Does patient have P4CC services?: No  Alcohol Screening:   Substance Abuse History in the last 12 months: denies alcohol or drug abuse  Consequences of Substance Abuse: Denies  Previous Psychotropic Medications: was not taking any medications prior to admission, reports he has not been on psychiatric medications in the past . Psychological Evaluations:  No  Past Medical History: history of urolithiasis. Past Medical History:  Diagnosis Date  . History of kidney stones     Past Surgical History:  Procedure Laterality Date  . EXTRACORPOREAL SHOCK WAVE LITHOTRIPSY Left 01/30/2017   Procedure: LEFT EXTRACORPOREAL SHOCK WAVE LITHOTRIPSY (ESWL);  Surgeon: Sebastian Acheheodore Manny, MD;  Location: [facility];  Service: Urology;  Laterality: Left;  . KIDNEY STONE SURGERY  y-3  . LITHOTRIPSY  2008  Family History:  Parents alive, divorced, no siblings  Family History  Problem Relation Age of Onset  . Diabetes Mellitus II Father   . Hyperlipidemia Father    Family Psychiatric  History: no history of mental illness in family, no history of suicides in family Tobacco Screening:  does not smoke or vape . Social History: 59,  married, has two children ( 13,16), lives with wife/children. Employed as an Art gallery manager . Social History   Substance and Sexual Activity  Alcohol Use Yes   Comment: rarely     Social History   Substance and Sexual Activity  Drug Use No    Additional Social History: Marital status: Married    Pain Medications: see MAR Prescriptions: see MAR Over the Counter: see MAR History of alcohol / drug use?: No history of alcohol / drug abuse Longest period of sobriety (when/how long): N/A  Allergies:  No Known Allergies Lab Results: No results found for this or any previous visit (from the past 48 hour(s)).  Blood Alcohol level:  No results found for: [facility]  Metabolic Disorder Labs:  No results found for: HGBA1C, MPG No results found for: PROLACTIN Lab Results  Component Value Date   CHOL 157 07/31/2018   TRIG 96.0 07/31/2018   HDL 39.70 07/31/2018   CHOLHDL 4 07/31/2018   VLDL 19.2 07/31/2018   LDLCALC 98 07/31/2018   LDLCALC 82 07/25/2017    Current Medications: Current Facility-Administered Medications  Medication Dose Route Frequency Provider Last Rate Last Dose  . acetaminophen (TYLENOL) tablet 650 mg  650 mg Oral Q6H PRN Aldean Baker, NP      . alum & mag hydroxide-simeth (MAALOX/MYLANTA) 200-200-20 MG/5ML suspension 30 mL  30 mL Oral Q4H PRN Aldean Baker, NP      . hydrOXYzine (ATARAX/VISTARIL) tablet 25 mg  25 mg Oral TID PRN Aldean Baker, NP      . magnesium hydroxide (MILK OF MAGNESIA) suspension 30 mL  30 mL Oral Daily PRN Aldean Baker, NP      . traZODone (DESYREL) tablet 50 mg  50 mg Oral QHS PRN Aldean Baker, NP       PTA Medications: No medications prior to admission.    Musculoskeletal: Strength & Muscle Tone: within normal limits Gait & Station: normal Patient leans: N/A  Psychiatric Specialty Exam: Physical Exam  Review of Systems  Constitutional: Negative.   Musculoskeletal: Negative.   Skin: Negative.     Blood pressure 106/79, pulse  65, temperature 97.9 F (36.6 C), temperature source Oral, resp. rate 18, SpO2 99 %.There is no height or weight on file to calculate BMI.  General Appearance: Well Groomed  Eye Contact:  Fair  Speech:  Normal Rate  Volume:  Decreased  Mood:  Anxious and Depressed  Affect:  Congruent and constricted/anxious  Thought Process:  Linear and Descriptions of Associations: Intact  Orientation:  Other:  fully alert and attentive  Thought Content:  no hallucinations, no delusions   Suicidal Thoughts:  No denies suicidal or self injurious ideations at this time, and contracts for safety on unit. Denies homicidal ideations.  Homicidal Thoughts:  No  Memory:  recent and remote grossly intact   Judgement:  Fair  Insight:  Fair  Psychomotor Activity:  Normal  Concentration:  Concentration: Good and Attention Span: Good  Recall:  Good  Fund of Knowledge:  Good  Language:  Good  Akathisia:  Negative  Handed:  Right  AIMS (if indicated):  Assets:  Desire for Improvement Resilience  ADL's:  Intact  Cognition:  WNL  Sleep:       Treatment Plan Summary: Daily contact with patient to assess and evaluate symptoms and progress in treatment, Medication management, Plan inpatient admission  and medications as below  Observation Level/Precautions:  15 minute checks  Laboratory:  CMP, CBC, TSH, UA, UDS  Psychotherapy:  Milieu, group therapy   Medications:  Start Lexapro 5 mgrs QDAY .  Start Ativan 0.5 mgrs Q 6 hours PRN for anxiety. Start Trazodone 50 mgrs QHS PRN .   Consultations:  As needed  Discharge Concerns:  -   Estimated LOS: 4-5 days   Other:     Physician Treatment Plan for Primary Diagnosis:  MDD Long Term Goal(s): Improvement in symptoms so as ready for discharge  Short Term Goals: Ability to identify changes in lifestyle to reduce recurrence of condition will improve and Ability to maintain clinical measurements within normal limits will improve  Physician Treatment Plan for  Secondary Diagnosis: Active Problems:   MDD (major depressive disorder)  Long Term Goal(s): Improvement in symptoms so as ready for discharge  Short Term Goals: Ability to identify changes in lifestyle to reduce recurrence of condition will improve, Ability to verbalize feelings will improve, Ability to disclose and discuss suicidal ideas, Ability to demonstrate self-control will improve, Ability to identify and develop effective coping behaviors will improve and Ability to maintain clinical measurements within normal limits will improve  I certify that inpatient services furnished can reasonably be expected to improve the patient's condition.    Craige Cotta, MD 2/20/20204:04 PM

## 2019-01-21 NOTE — BH Assessment (Addendum)
Assessment Note  Scott Sanchez is an 43 y.o. male who presented to [facility] as a walk-in seeking help for his depression and anxiety. Patient states that he was sent here by his PCP.  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  He states that because of this that he has been having problems making decisions, he has been very withdrawn, feeling fearful and having anxiety attacks.  Patient states that he has been experiencing racing thoughts at night which have been affecting his sleep and he states that he has not been sleeping more than 4 hours per night.  Patient states that he has not been eating well and states that he has lost 10-12 pounds in the past month without trying to lose weight.  Patient states that he has lost self-confidence in his abilities and states that he has been experiencing very depressed thoughts which include suicidal thoughts.  Patient states that he is not blatantly suicidal, but states that he has thoughts about being killed in a wreck that "is not my fault and so I don't have to decide."  Patient states that so far that he has been able to play the tape though and has been able to identify what his death would do to his family and the impact that they would experience.  Patient denies HI/Psychosis and states that he has no drug or alochol abuse issues. Patient states that has no previous mental health issues other than seeing a counselor thirty years ago after his parents divorced.  Patient states that he feels hopeless and helpless and states that he finds no enjoyment in life and states that he feels incompetent at times.  Patient states that he is highly anxious and states that he has been experiencing panic attacks for the past couple days.Patient states that he has been so stressed that he has been banging his head on the wall with  the intent to cause harm to himself. Patient presented as alert and oriented.  His thoughts were organized and his memory intact.  He did not appear to be responding to internal stimuli.  Patient's mood was depressed and his affect was flat.    Diagnosis: F32.3 MDD Single Episode Severe  Past Medical History:  Past Medical History:  Diagnosis Date  . History of kidney stones     Past Surgical History:  Procedure Laterality Date  . EXTRACORPOREAL SHOCK WAVE LITHOTRIPSY Left 01/30/2017   Procedure: LEFT EXTRACORPOREAL SHOCK WAVE LITHOTRIPSY (ESWL);  Surgeon: Sebastian Ache, MD;  Location: [facility];  Service: Urology;  Laterality: Left;  . KIDNEY STONE SURGERY  y-3  . LITHOTRIPSY  2008    Family History:  Family History  Problem Relation Age of Onset  . Diabetes Mellitus II Father   . Hyperlipidemia Father     Social History:  reports that he has never smoked. He has never used smokeless tobacco. He reports current alcohol use. He reports that he does not use drugs.  Additional Social History:  Alcohol / Drug Use Pain Medications: see MAR Prescriptions: see MAR Over the Counter: see MAR History of alcohol / drug use?: No history of alcohol / drug abuse Longest period of sobriety (when/how long): N/A  CIWA: CIWA-Ar BP: 106/79 Pulse Rate: 65 COWS:    Allergies: No Known Allergies  Home Medications:  No medications prior to  admission.    OB/GYN Status:  No LMP for male patient.  General Assessment Data [facility] Assessment: In system Is this a Tele or Face-to-Face Assessment?: Face-to-Face Is this an Initial Assessment or a Re-assessment for this encounter?: Initial Assessment Patient Accompanied by:: Other(wife) Language Other than English: No Living Arrangements: Other (Comment)(lives with wife and children) What gender do you identify as?: Male Marital status: Married Living Arrangements: Spouse/significant other,  Children Can pt return to current living arrangement?: Yes Admission Status: Voluntary Is patient capable of signing voluntary admission?: Yes Referral Source: MD 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 : Yes Suicidal Intent: No Has patient had any suicidal intent within the past 6 months prior to admission? : No Is patient at risk for suicide?: Yes Suicidal Plan?: Yes-Currently Present Has patient had any suicidal plan within the past 6 months prior to admission? : No Specify Current Suicidal Plan: (car wreck) Access to [employer]: Yes Specify Access to Suicidal Means: motor vehiicle What has been your use of drugs/alcohol within the last 12 months?: (none) Previous Attempts/Gestures: No How many times?: 0 Other Self Harm Risks: (job stress) Triggers for Past Attempts: None known Intentional Self Injurious Behavior: None Family Suicide History: No Recent stressful life event(s): Other (Comment)(job stress) Persecutory voices/beliefs?: No Depression: Yes Depression Symptoms: Despondent, Insomnia, Isolating, Fatigue, Loss of interest in usual pleasures, Feeling worthless/self pity Substance abuse history and/or treatment for substance abuse?: No Suicide prevention information given to non-admitted patients: Not applicable  Risk to Others within the past 6 months Homicidal Ideation: No Does patient have any lifetime risk of violence toward others beyond the six months prior to admission? : No Thoughts of Harm to Others: No Current Homicidal Intent:  No Current Homicidal Plan: No Access to Homicidal Means: No Identified Victim: none History of harm to others?: No Assessment of Violence: None Noted Violent Behavior Description: none Does patient have access to weapons?: No Criminal Charges Pending?: No Does patient have a court date: No Is patient on probation?: No  Psychosis Hallucinations: None noted Delusions: None noted  Mental Status Report Appearance/Hygiene: Unremarkable Eye Contact: Good Motor Activity: Freedom of movement Speech: Logical/coherent Level of Consciousness: Alert Mood: Depressed, Anxious, Anhedonia Affect: Flat Anxiety Level: Panic Attacks Thought Processes: Coherent, Relevant Judgement: Impaired Orientation: Person, Place, Time, Situation Obsessive Compulsive Thoughts/Behaviors: None  Cognitive Functioning Concentration: Decreased Memory: Recent Intact, Remote Intact Is patient IDD: No Insight: Fair Impulse Control: Fair Appetite: Poor Have you had any weight changes? : Loss Amount of the weight change? (lbs): 12 lbs Sleep: Decreased Total Hours of Sleep: 4 Vegetative Symptoms: None  ADLScreening [facility]) Patient's cognitive ability adequate to safely complete daily activities?: Yes Patient able to express need for assistance with ADLs?: Yes Independently performs ADLs?: Yes (appropriate for developmental age)  Prior Inpatient Therapy Prior Inpatient Therapy: No  Prior Outpatient Therapy Prior Outpatient Therapy: No Does patient have an ACCT team?: No Does patient have Intensive In-House Services?  : No Does patient have [facility] services? : No Does patient have P4CC services?: No  ADL Screening (  condition at time of admission) Patient's cognitive ability adequate to safely complete daily activities?: Yes Is the patient deaf or have difficulty hearing?: No Does the patient have difficulty seeing, even when wearing glasses/contacts?: No Does the patient have  difficulty concentrating, remembering, or making decisions?: No Patient able to express need for assistance with ADLs?: Yes Does the patient have difficulty dressing or bathing?: No Independently performs ADLs?: Yes (appropriate for developmental age) Does the patient have difficulty walking or climbing stairs?: No Weakness of Legs: None Weakness of Arms/Hands: None  Home Assistive Devices/Equipment Home Assistive Devices/Equipment: None  Therapy Consults (therapy consults require a physician order) PT Evaluation Needed: No OT Evalulation Needed: No SLP Evaluation Needed: No Abuse/Neglect Assessment (Assessment to be complete while patient is alone) Abuse/Neglect Assessment Can Be Completed: Yes Physical Abuse: Denies Verbal Abuse: Denies Sexual Abuse: Denies Exploitation of patient/patient's resources: Denies Self-Neglect: Denies Values / Beliefs Cultural Requests During Hospitalization: None Spiritual Requests During Hospitalization: None Consults Spiritual Care Consult Needed: No Social Work Consult Needed: No Merchant navy officer (For Healthcare) Does Patient Have a Medical Advance Directive?: No Would patient like information on creating a medical advance directive?: No - Patient declined Nutrition Screen- [facility] Has the patient recently lost weight without trying?: Yes, 2-13 lbs. Has the patient been eating poorly because of a decreased appetite?: Yes Malnutrition Screening Tool Score: 2        Disposition: Per Armandina Stammer, NP, patient meets inpatient admission criteria Disposition Initial Assessment Completed for this Encounter: Yes Disposition of Patient: Admit  On Site Evaluation by:   Reviewed with Physician:    Arnoldo Lenis Natacha Jepsen 01/21/2019 2:50 PM

## 2019-01-21 NOTE — Progress Notes (Signed)
-        0. Patient ID: Scott Sanchez, male    DOB: 04-Dec-1975  Age: 43 y.o. MRN: [id number]    Subjective:  Subjective  HPI Scott Sanchez presents for severe anxiety / panic attacks -- worsening over the last several weeks.  His wife is with him ---this has gone on for several years on and off but has been escalating per pt wife.  He has had thoughts of hurting himself but getting into an accident.   Work has been very stressful and he has been having trouble getting his work done.  This occurred when he was younger -- when his parents divorced -- he thought about jumping off a balcony--- but he did not.  His wife state he has been dry heaving and crying uncontrollably Review of Systems  Constitutional: Negative for appetite change, diaphoresis, fatigue and unexpected weight change.  Eyes: Negative for pain, redness and visual disturbance.  Respiratory: Negative for cough, chest tightness, shortness of breath and wheezing.   Cardiovascular: Negative for chest pain, palpitations and leg swelling.  Endocrine: Negative for cold intolerance, heat intolerance, polydipsia, polyphagia and polyuria.  Genitourinary: Negative for difficulty urinating, dysuria and frequency.  Neurological: Negative for dizziness, light-headedness, numbness and headaches.  Psychiatric/Behavioral: Positive for decreased concentration, dysphoric mood, self-injury, sleep disturbance and suicidal ideas. Negative for hallucinations. The patient is nervous/anxious. The patient is not hyperactive.     History Past Medical History:  Diagnosis Date  . History of kidney stones     He has a past surgical history that includes Kidney stone surgery (y-3); Lithotripsy (2008); and Extracorporeal shock wave lithotripsy (Left, 01/30/2017).   His family history includes Diabetes Mellitus II in his father; Hyperlipidemia in his father.He reports that he has never smoked. He has never used smokeless tobacco. He reports  current alcohol use. He reports that he does not use drugs.  No current outpatient medications on file prior to visit.   No current facility-administered medications on file prior to visit.      Objective:  Objective  Physical Exam Vitals signs and nursing note reviewed.  Constitutional:      General: He is sleeping.     Appearance: He is well-developed.  HENT:     Head: Normocephalic and atraumatic.  Eyes:     Pupils: Pupils are equal, round, and reactive to light.  Neck:     Musculoskeletal: Normal range of motion and neck supple.     Thyroid: No thyromegaly.  Cardiovascular:     Rate and Rhythm: Normal rate and regular rhythm.     Heart sounds: No murmur.  Pulmonary:     Effort: Pulmonary effort is normal. No respiratory distress.     Breath sounds: Normal breath sounds. No wheezing or rales.  Chest:     Chest wall: No tenderness.  Musculoskeletal:        General: No tenderness.  Skin:    General: Skin is warm and dry.  Neurological:     Mental Status: He is oriented to person, place, and time.  Psychiatric:        Mood and Affect: Mood is anxious and depressed. Affect is tearful.        Speech: Speech normal.        Behavior: Behavior normal.        Thought Content: Thought content includes suicidal ideation. Thought content includes suicidal plan.        Judgment: Judgment normal.  Comments: Pt has a plan of driving into traffic and causing an accident    BP 108/78 (BP Location: Right Arm, Cuff Size: Normal)   Pulse 95   Temp 98 F (36.7 C) (Oral)   Resp 16   Ht 5\' 11"  (1.803 m)   Wt 226 lb (102.5 kg)   SpO2 98%   BMI 31.52 kg/m  Wt Readings from Last 3 Encounters:  01/21/19 226 lb (102.5 kg)  12/29/18 237 lb (107.5 kg)  09/09/18 237 lb 4 oz (107.6 kg)     Lab Results  Component Value Date   WBC 3.7 (L) 07/31/2018   HGB 16.5 07/31/2018   HCT 46.6 07/31/2018   PLT 213.0 07/31/2018   GLUCOSE 85 07/31/2018   CHOL 157 07/31/2018   TRIG 96.0  07/31/2018   HDL 39.70 07/31/2018   LDLCALC 98 07/31/2018   ALT 23 07/31/2018   AST 17 07/31/2018   NA 137 07/31/2018   K 4.2 07/31/2018   CL 102 07/31/2018   CREATININE 1.07 07/31/2018   BUN 17 07/31/2018   CO2 30 07/31/2018   TSH 4.81 (H) 07/31/2018   PSA 0.19 07/31/2018   MICROALBUR 2.1 (H) 06/26/2015    No results found.   Assessment & Plan:  Plan  I have discontinued Odilon W. Hugill's azithromycin and HYDROcodone-homatropine.  No orders of the defined types were placed in this encounter.   Problem List Items Addressed This Visit      Unprioritized   Suicidal behavior without attempted self-injury - Primary    D/w [facility]  Pt wife to take him to [facility] for evaluation          Follow-up: Return if symptoms worsen or fail to improve.  Donato Schultz, DO

## 2019-01-21 NOTE — Patient Instructions (Signed)
Suicidal Feelings: How to Help Yourself °Suicide is when you end your own life. There are many things you can do to help yourself feel better when struggling with these feelings. Many services and people are available to support you and others who struggle with similar feelings.  °If you ever feel like you may hurt yourself or others, or have thoughts about taking your own life, get help right away. To get help: °· Call your local emergency services (911 in the [place] °· Go to your nearest emergency department. °· Call a suicide hotline to speak with a trained counselor. The following suicide hotlines are available in the [place]: °? 1-800-273-TALK (1-800-273-8255). °? 1-800-SUICIDE (1-800-784-2433). °? 1-888-628-9454. This is a hotline for Spanish speakers. °? 1-800-799-4889. This is a hotline for TTY users. °? 1-866-4-U-TREVOR (1-866-488-7386). This is a hotline for lesbian, gay, bisexual, transgender, or questioning youth. °? For a list of hotlines in [place], visit www.suicide.org/hotlines/international/[place]-suicide-hotlines.html °· Contact a crisis center or a local suicide prevention center. To find a crisis center or suicide prevention center: °? Call your local hospital, clinic, community service organization, mental health center, social service provider, or health department. Ask for help with connecting to a crisis center. °? For a list of crisis centers in the [place], visit: suicidepreventionlifeline.org °? For a list of crisis centers in [place], visit: suicideprevention.[place] °How to help yourself feel better ° °· Promise yourself that you will not do anything extreme when you have suicidal feelings. Remember, there is hope. Many people have gotten through suicidal thoughts and feelings, and you can too. If you have had these feelings before, remind yourself that you can get through them again. °· Let family, friends, teachers, or counselors know how you are feeling. Try not to separate  yourself from those who care about you and want to help you. Talk with someone every day, even if you do not feel sociable. Face-to-face conversation is best to help them understand your feelings. °· Contact a mental health care provider and work with this person regularly. °· Make a safety plan that you can follow during a crisis. Include phone numbers of suicide prevention hotlines, mental health professionals, and trusted friends and family members you can call during an emergency. Save these numbers on your phone. °· If you are thinking of taking a lot of medicine, give your medicine to someone who can give it to you as prescribed. If you are on antidepressants and are concerned you will overdose, tell your health care provider so that he or she can give you safer medicines. °· Try to stick to your routines. Follow a schedule every day. Make self-care a priority. °· Make a list of realistic goals, and cross them off when you achieve them. Accomplishments can give you a sense of worth. °· Wait until you are feeling better before doing things that you find difficult or unpleasant. °· Do things that you have always enjoyed to take your mind off your feelings. Try reading a book, or listening to or playing music. Spending time outside, in nature, may help you feel better. °Follow these instructions at home: ° °· Visit your primary health care provider every year for a checkup. °· Work with a mental health care provider as needed. °· Eat a well-balanced diet, and eat regular meals. °· Get plenty of rest. °· Exercise if you are able. Just 30 minutes of exercise each day can help you feel better. °· Take over-the-counter and prescription medicines only as told by   your health care provider. Ask your mental health care provider about the possible side effects of any medicines you are taking. °· Do not use alcohol or drugs, and remove these substances from your home. °· Remove weapons, poisons, knives, and other deadly  items from your home. °General recommendations °· Keep your living space well lit. °· When you are feeling well, write yourself a letter with tips and support that you can read when you are not feeling well. °· Remember that life's difficulties can be sorted out with help. Conditions can be treated, and you can learn behaviors and ways of thinking that will help you. °Where to find more information °· National Suicide Prevention Lifeline: www.suicidepreventionlifeline.org °· Hopeline: www.hopeline.com °· American Foundation for Suicide Prevention: www.afsp.org °· The Trevor Project (for lesbian, gay, bisexual, transgender, or questioning youth): www.thetrevorproject.org °Contact a health care provider if: °· You feel as though you are a burden to others. °· You feel agitated, angry, vengeful, or have extreme mood swings. °· You have withdrawn from family and friends. °Get help right away if: °· You are talking about suicide or wishing to die. °· You start making plans for how to commit suicide. °· You feel that you have no reason to live. °· You start making plans for putting your affairs in order, saying goodbye, or giving your possessions away. °· You feel guilt, shame, or unbearable pain, and it seems like there is no way out. °· You are frequently using drugs or alcohol. °· You are engaging in risky behaviors that could lead to death. °If you have any of these symptoms, get help right away. Call emergency services, go to your nearest emergency department or crisis center, or call a suicide crisis helpline. °Summary °· Suicide is when you take your own life. °· Promise yourself that you will not do anything extreme when you have suicidal feelings. °· Let family, friends, teachers, or counselors know how you are feeling. °· Get help right away if you feel as though life is getting too tough to handle and you are thinking about suicide. °This information is not intended to replace advice given to you by your health  care provider. Make sure you discuss any questions you have with your health care provider. °Document Released: 05/25/2003 Document Revised: 07/01/2017 Document Reviewed: 07/01/2017 °Elsevier Interactive Patient Education © 2019 [employer] ° °

## 2019-01-22 LAB — COMPREHENSIVE METABOLIC PANEL
ALT: 114 U/L — ABNORMAL HIGH (ref 0–44)
AST: 53 U/L — ABNORMAL HIGH (ref 15–41)
Albumin: 4.3 g/dL (ref 3.5–5.0)
Alkaline Phosphatase: 92 U/L (ref 38–126)
Anion gap: 10 (ref 5–15)
BUN: 12 mg/dL (ref 6–20)
CO2: 22 mmol/L (ref 22–32)
Calcium: 9.3 mg/dL (ref 8.9–10.3)
Chloride: 104 mmol/L (ref 98–111)
Creatinine, Ser: 1 mg/dL (ref 0.61–1.24)
GFR calc Af Amer: >60 mL/min (ref >60)
GFR calc non Af Amer: >60 mL/min (ref >60)
Glucose, Bld: 93 mg/dL (ref 70–99)
Potassium: 3.8 mmol/L (ref 3.5–5.1)
Sodium: 136 mmol/L (ref 135–145)
Total Bilirubin: 1.5 mg/dL — ABNORMAL HIGH (ref 0.3–1.2)
Total Protein: 6.9 g/dL (ref 6.5–8.1)

## 2019-01-22 LAB — URINALYSIS, ROUTINE W REFLEX MICROSCOPIC
Bilirubin Urine: NEGATIVE
Glucose, UA: NEGATIVE mg/dL
Hgb urine dipstick: NEGATIVE
Ketones, ur: NEGATIVE mg/dL
Leukocytes,Ua: NEGATIVE
Nitrite: NEGATIVE
Protein, ur: NEGATIVE mg/dL
Specific Gravity, Urine: 1.021 (ref 1.005–1.030)
pH: 6 (ref 5.0–8.0)

## 2019-01-22 LAB — HEMOGLOBIN A1C
Hgb A1c MFr Bld: 4.6 % — ABNORMAL LOW (ref 4.8–5.6)
Mean Plasma Glucose: 85.32 mg/dL

## 2019-01-22 LAB — RAPID URINE DRUG SCREEN, HOSP PERFORMED
Amphetamines: NOT DETECTED
Barbiturates: NOT DETECTED
Benzodiazepines: NOT DETECTED
Cocaine: NOT DETECTED
Opiates: NOT DETECTED
Tetrahydrocannabinol: NOT DETECTED

## 2019-01-22 LAB — CBC
HCT: 50.2 % (ref 39.0–52.0)
Hemoglobin: 16.5 g/dL (ref 13.0–17.0)
MCH: 29.8 pg (ref 26.0–34.0)
MCHC: 32.9 g/dL (ref 30.0–36.0)
MCV: 90.8 fL (ref 80.0–100.0)
Platelets: 276 10*3/uL (ref 150–400)
RBC: 5.53 MIL/uL (ref 4.22–5.81)
RDW: 13.2 % (ref 11.5–15.5)
WBC: 4.4 10*3/uL (ref 4.0–10.5)
nRBC: 0 % (ref 0.0–0.2)

## 2019-01-22 LAB — LIPID PANEL
Cholesterol: 134 mg/dL (ref 0–200)
HDL: 41 mg/dL (ref >40)
LDL Cholesterol: 74 mg/dL (ref 0–99)
Total CHOL/HDL Ratio: 3.3 RATIO
Triglycerides: 97 mg/dL (ref <150)
VLDL: 19 mg/dL (ref 0–40)

## 2019-01-22 LAB — TSH: TSH: 3.667 u[IU]/mL (ref 0.350–4.500)

## 2019-01-22 LAB — ETHANOL: Alcohol, Ethyl (B): <10 mg/dL (ref <10)

## 2019-01-22 MED ORDER — ESCITALOPRAM OXALATE 10 MG PO TABS
10.0000 mg | ORAL_TABLET | Freq: Every day | ORAL | Status: DC
  Administered 2019-01-23 – 2019-01-25 (×3): 10 mg via ORAL
  Filled 2019-01-22 (×4): qty 1

## 2019-01-22 NOTE — [facility] Counselor (Signed)
Adult Comprehensive Assessment  Patient ID: Scott Sanchez, male   DOB: Aug 29, 1976, 43 y.o.   MRN: [id number]  Information Source: Information source: Patient  Current Stressors:  Patient states their primary concerns and needs for treatment are:: "I have been experienicing a lot of anxiety that is keeping me from functioning at work. I have a lot of stress about my job and project that is due" Patient states their goals for this hospitilization and ongoing recovery are:: "I want to function better" Educational / Learning stressors: N/A  Employment / Job issues: Patient reports his job is very demanding and stressful. He reports he is constantly worrying about completing a project that is due in the next few months. The patient reports this stress has led to panic attacks, worsening depressive symptoms and suicidal ideation.  Family Relationships: Patient reports he has become withdrawn from his family due to the amount of stress his job places on him.  Financial / Lack of resources (include bankruptcy): Patient denies any current stressors  Housing / Lack of housing: Lives with his wife and two children in [place], [place]; Denies any stressors  Physical health (include injuries & life threatening diseases): Patient denies any current stressors  Social relationships: Patient denies any current stressors  Substance abuse: Patient denies any current stressors  Bereavement / Loss: Patient denies any current stressors   Living/Environment/Situation:  Living Arrangements: Spouse/significant other, Children Living conditions (as described by patient or guardian): "Very good" Who else lives in the home?: Wife and two teenage children  How long has patient lived in current situation?: 18 years  What is atmosphere in current home: Comfortable, Supportive, Loving  Family History:  Marital status: Married  Childhood History:  By whom was/is the patient raised?: Mother, Mother/father and  step-parent Additional childhood history information: Patient reports his parents divorced when he was 10 years. He states his mother remarried.  Description of patient's relationship with caregiver when they were a child: Patient reports having a good relationship with his mother and step father during his childhood. Patient reports that his father moved to [place] after he and the patient's mother divorced.  Patient's description of current relationship with people who raised him/her: Patient reports he and his mother continue to have a good relationship. He states that he and his father have a distant relationship, however they are cordial with one another.  How were you disciplined when you got in trouble as a child/adolescent?: "I didnt really get into trouble" Does patient have siblings?: No Did patient suffer any verbal/emotional/physical/sexual abuse as a child?: No Did patient suffer from severe childhood neglect?: No Has patient ever been sexually abused/assaulted/raped as an adolescent or adult?: No Was the patient ever a victim of a crime or a disaster?: No Witnessed domestic violence?: No Has patient been effected by domestic violence as an adult?: No  Education:  Highest grade of school patient has completed: Education administrator degree  Currently a Consulting civil engineer?: No Learning disability?: No  Employment/Work Situation:   Employment situation: Employed 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, retail  How long has patient been employed?: 19 years  Patient's job has been impacted by current illness: Yes Describe how patient's job has been impacted: Unable to complete tasks due to anxiety  What is the longest time patient has a held a job?: 19 years  Where was the patient employed at that time?: Current job  Did You Receive Any Psychiatric Treatment/Services While in Equities trader?: No Are  There Guns or Other Weapons in Your Home?: No  Financial Resources:    Financial resources: Income from employment Does patient have a representative payee or guardian?: No  Alcohol/Substance Abuse:   What has been your use of drugs/alcohol within the last 12 months?: Patient denies If attempted suicide, did drugs/alcohol play a role in this?: No Alcohol/Substance Abuse Treatment Hx: Denies past history Has alcohol/substance abuse ever caused legal problems?: No  Social Support System:   Patient's Community Support System: Good Describe Community Support System: "My family and co workers" Type of faith/religion: None  How does patient's faith help to cope with current illness?: N/A   Leisure/Recreation:   Leisure and Hobbies: "Engineer, structural and other electronic projects"  Strengths/Needs:   What is the patient's perception of their strengths?: "I'm hardworking, considerate of others and I am intelligent"  Patient states they can use these personal strengths during their treatment to contribute to their recovery: Yes  Patient states these barriers may affect/interfere with their treatment: "Yes, maybe my own confusion" Patient states these barriers may affect their return to the community: No  Other important information patient would like considered in planning for their treatment: No   Discharge Plan:   Currently receiving community mental health services: No Patient states concerns and preferences for aftercare planning are: Patient would like to be referred to an outpatient provider for medication management and therapy services  Patient states they will know when they are safe and ready for discharge when: Yes, patient reports he feels as though he is ready to discharge  Does patient have access to transportation?: Yes Does patient have financial barriers related to discharge medications?: No Will patient be returning to same living situation after discharge?: Yes  Summary/Recommendations:   Summary and Recommendations (to be completed by the  evaluator): Scott Sanchez is a 43 year old male who is diagnosed with MDD Single Episode Severe. He presented to the hospital seeking treatment for worsening depressive and anxiety symptoms. During the assessment, Aneta Mins was pleasant and cooperative with providing information. Aneta Mins reports that his main stressor is his stressful job. Aneta Mins reports worrying about his job has caused him to withdraw from his family and experience panic attacks. Aneta Mins also reports that he has experienced suicidal ideation, however he never thought of an actual plan. Aneta Mins requested to be referred to an outpatient provider for medication management and therapy services. Aneta Mins can benefit from crisis stabilization, medication management, therapeutic milieu and referral services.   Maeola Sarah. 01/22/2019

## 2019-01-22 NOTE — Progress Notes (Signed)
D: Pt denies SI/HI/AVH. Pt is pleasant and cooperative. Pt stated he was feeling better due to getting sleep and learning ways to help cope with anxiety, and also believes the medication is helping.  A: Pt was offered support and encouragement. Pt was given scheduled medications. Pt was encourage to attend groups. Q 15 minute checks were done for safety.  R:Pt attends groups and interacts well with peers and staff. Pt is taking medication. Pt has no complaints.Pt receptive to treatment and safety maintained on unit.- Problem: Coping: Goal: Ability to identify and develop effective coping behavior will improve Outcome: Progressing   Problem: Self-Concept: Goal: Level of anxiety will decrease Outcome: Progressing   Problem: Self-Concept: Goal: Ability to modify response to factors that promote anxiety will improve Outcome: Progressing

## 2019-01-22 NOTE — Progress Notes (Signed)
Pt presents with a flat affect and an anxious mood. Pt expressed having lots of anxiety upon waking up this morning. Pt stated that his anxiety is usually higher in the mornings. Pt expressed that he's unsure as to whether or not he's depressed. Pt was administered Ativan at his request for increased anxiety. Pt reported that the Ativan was effective for relieving his anxiety. Pt denies SI/HI. Pt refused to take ensure and verbalized having an improved appetite.   Medications administered as ordered per MD. Verbal support provided. Pt encouraged to attend groups. 15 minute checks performed for safety.   Pt compliant with tx plan. Pt stated goal "to begin to understand how to work through anxiety and coping skills."

## 2019-01-22 NOTE — [facility] Group Notes (Signed)
Adult Psychoeducational Group Note  Date:  01/22/2019 Time:  10:27 PM  Group Topic/Focus:  Wrap-Up Group:   The focus of this group is to help patients review their daily goal of treatment and discuss progress on daily workbooks.  Participation Level:  Active  Participation Quality:  Appropriate and Attentive  Affect:  Appropriate  Cognitive:  Alert and Appropriate  Insight: Appropriate and Good  Engagement in Group:  Engaged  Modes of Intervention:  Discussion and Education  Additional Comments:  Pt attended and participated in wrap up roup this evening. Pt rated their day a 6/10, due to them trying to get "acclaimed" and getting better. Pt goal is to deal with life better in the mornings, they think a lot when they first wake up.   Chrisandra Netters 01/22/2019, 10:27 PM

## 2019-01-22 NOTE — Progress Notes (Signed)
[facility] MD Progress Note  01/22/2019 3:24 PM [place]  MRN:  [id number] Subjective: Patient reports some improvement, although continues to ruminate about his job-related stressors.  Today denies suicidal ideations.  Denies any medication side effects.  States he feels medications are helping partially, and describes feeling less anxious following Ativan PRN.  He also reports he slept better last night.  Objective: Have discussed case with treatment team and have met with patient. 43 year old male, married, 2 children, employed as an Chief Financial Officer.  Presents for worsening depression, neurovegetative symptoms of depression, significant anxiety, recent suicidal ideations with thoughts of crashing car, episodes of head-banging.  He reports she has been under a significant amount of stress related work issues-specifically to an important work related project he is heading, and which has an Teacher, early years/pre.  He denies prior psychiatric admissions/ treatment . Today patient describes some improvement.  He is feeling less severely anxious, although still remains anxious and ruminative, mainly about his job-related stressors.  He denies any suicidal ideations and identifies his wife and children as protective factors from hurting himself.  Although he continues to worry and ruminate about the work-related project that he is in charge of, today states he has been considering speaking with his employer about stepping down from the project leadership.  He continues to present anxious, vaguely constricted in affect, does smile appropriately at times during session. Thus far tolerating Lexapro and Ativan PRN's well, denies side effects, presents somewhat anxious about medications and potential side effects, responds fairly well to reassurance and support.  No disruptive or agitated behaviors on unit.  Labs reviewed-AST/ALT slightly elevated, hemoglobin A1c 4.6, TSH 3.66. Principal Problem: MDD, consider adjustment  disorder with depression and anxiety  Diagnosis: Active Problems:   MDD (major depressive disorder)  Total Time spent with patient: 20 minutes  Past Psychiatric History:   Past Medical History:  Past Medical History:  Diagnosis Date  . History of kidney stones     Past Surgical History:  Procedure Laterality Date  . EXTRACORPOREAL SHOCK WAVE LITHOTRIPSY Left 01/30/2017   Procedure: LEFT EXTRACORPOREAL SHOCK WAVE LITHOTRIPSY (ESWL);  Surgeon: Alexis Frock, MD;  Location: [facility];  Service: Urology;  Laterality: Left;  . KIDNEY STONE SURGERY  y-3  . LITHOTRIPSY  2008   Family History:  Family History  Problem Relation Age of Onset  . Diabetes Mellitus II Father   . Hyperlipidemia Father    Family Psychiatric  History:  Social History:  Social History   Substance and Sexual Activity  Alcohol Use Yes   Comment: rarely, "once a year"     Social History   Substance and Sexual Activity  Drug Use No    Social History   Socioeconomic History  . Marital status: Married    Spouse name: Not on file  . Number of children: Not on file  . Years of education: Not on file  . Highest education level: Not on file  Occupational History  . Occupation: Museum/gallery curator  Social Needs  . Financial resource strain: Not on file  . Food insecurity:    Worry: Not on file    Inability: Not on file  . Transportation needs:    Medical: Not on file    Non-medical: Not on file  Tobacco Use  . Smoking status: Never Smoker  . Smokeless tobacco: Never Used  Substance and Sexual Activity  . Alcohol use: Yes    Comment: rarely, "once a year"  . Drug use: No  .  Sexual activity: Yes    Partners: Female    Birth control/protection: Surgical  Lifestyle  . Physical activity:    Days per week: Not on file    Minutes per session: Not on file  . Stress: Not on file  Relationships  . Social connections:    Talks on phone: Not on file    Gets together: Not on file    Attends religious  service: Not on file    Active member of club or organization: Not on file    Attends meetings of clubs or organizations: Not on file    Relationship status: Not on file  Other Topics Concern  . Not on file  Social History Narrative   Exercise-- no   Additional Social History:    Pain Medications: see MAR Prescriptions: see MAR Over the Counter: see MAR History of alcohol / drug use?: No history of alcohol / drug abuse Longest period of sobriety (when/how long): N/A  Sleep: Slept better last night, but sleep is still suboptimal  Appetite:  Fair  Current Medications: Current Facility-Administered Medications  Medication Dose Route Frequency Provider Last Rate Last Dose  . acetaminophen (TYLENOL) tablet 650 mg  650 mg Oral Q6H PRN Connye Burkitt, NP      . alum & mag hydroxide-simeth (MAALOX/MYLANTA) 200-200-20 MG/5ML suspension 30 mL  30 mL Oral Q4H PRN Connye Burkitt, NP      . escitalopram (LEXAPRO) tablet 5 mg  5 mg Oral Daily Cobos, Myer Peer, MD   5 mg at 01/22/19 0820  . feeding supplement (ENSURE ENLIVE) (ENSURE ENLIVE) liquid 237 mL  237 mL Oral BID BM Cobos, Fernando A, MD      . LORazepam (ATIVAN) tablet 0.5 mg  0.5 mg Oral Q6H PRN Cobos, Myer Peer, MD   0.5 mg at 01/22/19 0820  . magnesium hydroxide (MILK OF MAGNESIA) suspension 30 mL  30 mL Oral Daily PRN Connye Burkitt, NP      . traZODone (DESYREL) tablet 50 mg  50 mg Oral QHS PRN Connye Burkitt, NP   50 mg at 01/21/19 2111    Lab Results:  Results for orders placed or performed during the hospital encounter of 01/21/19 (from the past 48 hour(s))  CBC     Status: None   Collection Time: 01/22/19  6:40 AM  Result Value Ref Range   WBC 4.4 4.0 - 10.5 K/uL   RBC 5.53 4.22 - 5.81 MIL/uL   Hemoglobin 16.5 13.0 - 17.0 g/dL   HCT 50.2 39.0 - 52.0 %   MCV 90.8 80.0 - 100.0 fL   MCH 29.8 26.0 - 34.0 pg   MCHC 32.9 30.0 - 36.0 g/dL   RDW 13.2 11.5 - 15.5 %   Platelets 276 150 - 400 K/uL   nRBC 0.0 0.0 - 0.2 %     Comment: Performed at [facility], [place] [address], [place], [place] [address]  Comprehensive metabolic panel     Status: Abnormal   Collection Time: 01/22/19  6:40 AM  Result Value Ref Range   Sodium 136 135 - 145 mmol/L   Potassium 3.8 3.5 - 5.1 mmol/L   Chloride 104 98 - 111 mmol/L   CO2 22 22 - 32 mmol/L   Glucose, Bld 93 70 - 99 mg/dL   BUN 12 6 - 20 mg/dL   Creatinine, Ser 1.00 0.61 - 1.24 mg/dL   Calcium 9.3 8.9 - 10.3 mg/dL   Total Protein 6.9  6.5 - 8.1 g/dL   Albumin 4.3 3.5 - 5.0 g/dL   AST 53 (H) 15 - 41 U/L   ALT 114 (H) 0 - 44 U/L   Alkaline Phosphatase 92 38 - 126 U/L   Total Bilirubin 1.5 (H) 0.3 - 1.2 mg/dL   GFR calc non Af Amer >60 >60 mL/min   GFR calc Af Amer >60 >60 mL/min   Anion gap 10 5 - 15    Comment: Performed at [facility], [place] [address] [place], [place] [address]  Hemoglobin A1c     Status: Abnormal   Collection Time: 01/22/19  6:40 AM  Result Value Ref Range   Hgb A1c MFr Bld 4.6 (L) 4.8 - 5.6 %    Comment: (NOTE) Pre diabetes:          5.7%-6.4% Diabetes:              >6.4% Glycemic control for   <7.0% adults with diabetes    Mean Plasma Glucose 85.32 mg/dL    Comment: Performed at [place] [address], [place], [place] [address]  Ethanol     Status: None   Collection Time: 01/22/19  6:40 AM  Result Value Ref Range   Alcohol, Ethyl (B) <10 <10 mg/dL    Comment: (NOTE) Lowest detectable limit for serum alcohol is 10 mg/dL. For medical purposes only. Performed at [facility], [place] [address], [place], [place] [address]   Lipid panel     Status: None   Collection Time: 01/22/19  6:40 AM  Result Value Ref Range   Cholesterol 134 0 - 200 mg/dL   Triglycerides 97 <150 mg/dL   HDL 41 >40 mg/dL   Total CHOL/HDL Ratio 3.3 RATIO   VLDL 19 0 - 40 mg/dL   LDL Cholesterol 74 0 - 99 mg/dL    Comment:        Total Cholesterol/HDL:CHD Risk Coronary Heart Disease Risk  Table                     Men   Women  1/2 Average Risk   3.4   3.3  Average Risk       5.0   4.4  2 X Average Risk   9.6   7.1  3 X Average Risk  23.4   11.0        Use the calculated Patient Ratio above and the CHD Risk Table to determine the patient's CHD Risk.        ATP III CLASSIFICATION (LDL):  <100     mg/dL   Optimal  100-129  mg/dL   Near or Above                    Optimal  130-159  mg/dL   Borderline  160-189  mg/dL   High  >190     mg/dL   Very High Performed at [facility] [address] [place], [place] [address]   TSH     Status: None   Collection Time: 01/22/19  6:40 AM  Result Value Ref Range   TSH 3.667 0.350 - 4.500 uIU/mL    Comment: Performed by a 3rd Generation assay with a functional sensitivity of <=0.01 uIU/mL. Performed at [facility], [place] [address], [place], [place] [address]     Blood Alcohol level:  Lab Results  Component Value Date   ETH <10 01/22/2019  Metabolic Disorder Labs: Lab Results  Component Value Date   HGBA1C 4.6 (L) 01/22/2019   MPG 85.32 01/22/2019   No results found for: PROLACTIN Lab Results  Component Value Date   CHOL 134 01/22/2019   TRIG 97 01/22/2019   HDL 41 01/22/2019   CHOLHDL 3.3 01/22/2019   VLDL 19 01/22/2019   LDLCALC 74 01/22/2019   LDLCALC 98 07/31/2018    Physical Findings: AIMS: Facial and Oral Movements Muscles of Facial Expression: None, normal Lips and Perioral Area: None, normal Jaw: None, normal Tongue: None, normal,Extremity Movements Upper (arms, wrists, hands, fingers): None, normal Lower (legs, knees, ankles, toes): None, normal, Trunk Movements Neck, shoulders, hips: None, normal, Overall Severity Severity of abnormal movements (highest score from questions above): None, normal Incapacitation due to abnormal movements: None, normal Patient's awareness of abnormal movements (rate only patient's report): No Awareness, Dental Status Current  problems with teeth and/or dentures?: No Does patient usually wear dentures?: No  CIWA:    COWS:     Musculoskeletal: Strength & Muscle Tone: within normal limits Gait & Station: normal Patient leans: N/A  Psychiatric Specialty Exam: Physical Exam  ROS no chest pain, no shortness of breath, no vomiting  Blood pressure 110/85, pulse (!) 104, temperature 98.5 F (36.9 C), temperature source Oral, resp. rate 18, height _0  (1.803 m), weight 100.7 kg, SpO2 99 %.Body mass index is 30.96 kg/m.  General Appearance: Fairly Groomed  Eye Contact:  Good  Speech:  Normal Rate  Volume:  Normal  Mood:  Anxious and Depressed, states he is feeling better today  Affect:  Remains anxious, constricted, does improve partially during session, smiles appropriately at times  Thought Process:  Linear and Descriptions of Associations: Intact  Orientation:  Full (Time, Place, and Person)  Thought Content:  No hallucinations, no delusions, ruminative about stressors  Suicidal Thoughts:  No-at this time denies suicidal or self-injurious ideations, contracts for safety on unit, identifies his family/children as protective factors against hurting himself  Homicidal Thoughts:  No  Memory:  Recent and remote grossly intact  Judgement:  Fair/improving  Insight:  Fair/improving  Psychomotor Activity:  Normal-no current psychomotor restlessness or agitation  Concentration:  Concentration: Good and Attention Span: Good  Recall:  Good  Fund of Knowledge:  Good  Language:  Good  Akathisia:  Negative  Handed:  Right  AIMS (if indicated):     Assets:  Communication Skills Desire for Improvement Resilience  ADL's:  Intact  Cognition:  WNL  Sleep:  Number of Hours: 6.75   Assessment: 43 year old male, married, 2 children, employed as an Chief Financial Officer.  Presents for worsening depression, neurovegetative symptoms of depression, significant anxiety, recent suicidal ideations with thoughts of crashing car, episodes  of head-banging.  He reports she has been under a significant amount of stress related work issues-specifically to an important work related project he is heading, and which has an Teacher, early years/pre.  He denies prior psychiatric admissions/ treatment .  Today patient reports feeling partially better than he did on admission.  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  He does continue to present depressed, vaguely anxious, although affect is noted to be more reactive today.  Currently tolerating medications well (on Lexapro and on Ativan PRN's for anxiety)  Treatment  Plan Summary: Daily contact with patient to assess and evaluate symptoms and progress in treatment, Medication management, Plan Inpatient treatment and Medications as below Encourage group and milieu participation to work on coping skills and symptom reduction Continue Ativan 0.5 mg every 6 hours PRN for anxiety Continue Trazodone 50 mg nightly PRN for insomnia Increase Lexapro to 10 mg daily for depression and anxiety Treatment team working on disposition planning options.  Jenne Campus, MD 01/22/2019, 3:24 PM

## 2019-01-22 NOTE — Tx Team (Signed)
Interdisciplinary Treatment and Diagnostic Plan Update  01/22/2019 Time of Session: [place] MRN: [id number]  Principal Diagnosis: <principal problem not specified>  Secondary Diagnoses: Active Problems:   MDD (major depressive disorder)   Current Medications:  Current Facility-Administered Medications  Medication Dose Route Frequency Provider Last Rate Last Dose  . acetaminophen (TYLENOL) tablet 650 mg  650 mg Oral Q6H PRN Connye Burkitt, NP      . alum & mag hydroxide-simeth (MAALOX/MYLANTA) 200-200-20 MG/5ML suspension 30 mL  30 mL Oral Q4H PRN Connye Burkitt, NP      . escitalopram (LEXAPRO) tablet 5 mg  5 mg Oral Daily Cobos, Myer Peer, MD   5 mg at 01/22/19 0820  . feeding supplement (ENSURE ENLIVE) (ENSURE ENLIVE) liquid 237 mL  237 mL Oral BID BM Cobos, Fernando A, MD      . LORazepam (ATIVAN) tablet 0.5 mg  0.5 mg Oral Q6H PRN Cobos, Myer Peer, MD   0.5 mg at 01/22/19 0820  . magnesium hydroxide (MILK OF MAGNESIA) suspension 30 mL  30 mL Oral Daily PRN Connye Burkitt, NP      . traZODone (DESYREL) tablet 50 mg  50 mg Oral QHS PRN Connye Burkitt, NP   50 mg at 01/21/19 2111   PTA Medications: No medications prior to admission.    Patient Stressors: Occupational concerns  Patient Strengths: Ability for insight Average or above average intelligence Capable of independent living Child psychotherapist Motivation for treatment/growth Physical Health Supportive family/friends Work skills  Treatment Modalities: Medication Management, Group therapy, Case management,  1 to 1 session with clinician, Psychoeducation, Recreational therapy.   Physician Treatment Plan for Primary Diagnosis: <principal problem not specified> Long Term Goal(s): Improvement in symptoms so as ready for discharge Improvement in symptoms so as ready for discharge   Short Term Goals: Ability to identify changes in lifestyle to reduce recurrence of condition will  improve Ability to maintain clinical measurements within normal limits will improve Ability to identify changes in lifestyle to reduce recurrence of condition will improve Ability to verbalize feelings will improve Ability to disclose and discuss suicidal ideas Ability to demonstrate self-control will improve Ability to identify and develop effective coping behaviors will improve Ability to maintain clinical measurements within normal limits will improve  Medication Management: Evaluate patient's response, side effects, and tolerance of medication regimen.  Therapeutic Interventions: 1 to 1 sessions, Unit Group sessions and Medication administration.  Evaluation of Outcomes: Not Met  Physician Treatment Plan for Secondary Diagnosis: Active Problems:   MDD (major depressive disorder)  Long Term Goal(s): Improvement in symptoms so as ready for discharge Improvement in symptoms so as ready for discharge   Short Term Goals: Ability to identify changes in lifestyle to reduce recurrence of condition will improve Ability to maintain clinical measurements within normal limits will improve Ability to identify changes in lifestyle to reduce recurrence of condition will improve Ability to verbalize feelings will improve Ability to disclose and discuss suicidal ideas Ability to demonstrate self-control will improve Ability to identify and develop effective coping behaviors will improve Ability to maintain clinical measurements within normal limits will improve     Medication Management: Evaluate patient's response, side effects, and tolerance of medication regimen.  Therapeutic Interventions: 1 to 1 sessions, Unit Group sessions and Medication administration.  Evaluation of Outcomes: Not Met   RN Treatment Plan for Primary Diagnosis: <principal problem not specified> Long Term Goal(s): Knowledge of disease and therapeutic regimen to maintain health  will improve  Short Term Goals: Ability  to participate in decision making will improve, Ability to verbalize feelings will improve, Ability to disclose and discuss suicidal ideas, Ability to identify and develop effective coping behaviors will improve and Compliance with prescribed medications will improve  Medication Management: RN will administer medications as ordered by provider, will assess and evaluate patient's response and provide education to patient for prescribed medication. RN will report any adverse and/or side effects to prescribing provider.  Therapeutic Interventions: 1 on 1 counseling sessions, Psychoeducation, Medication administration, Evaluate responses to treatment, Monitor vital signs and CBGs as ordered, Perform/monitor CIWA, COWS, AIMS and Fall Risk screenings as ordered, Perform wound care treatments as ordered.  Evaluation of Outcomes: Not Met   LCSW Treatment Plan for Primary Diagnosis: <principal problem not specified> Long Term Goal(s): Safe transition to appropriate next level of care at discharge, Engage patient in therapeutic group addressing interpersonal concerns.  Short Term Goals: Engage patient in aftercare planning with referrals and resources  Therapeutic Interventions: Assess for all discharge needs, 1 to 1 time with Social worker, Explore available resources and support systems, Assess for adequacy in community support network, Educate family and significant other(s) on suicide prevention, Complete Psychosocial Assessment, Interpersonal group therapy.  Evaluation of Outcomes: Not Met   Progress in Treatment: Attending groups: Yes. Participating in groups: Yes. Taking medication as prescribed: Yes. Toleration medication: Yes. Family/Significant other contact made: Yes, individual(s) contacted:  the patient's wife Patient understands diagnosis: Yes. Discussing patient identified problems/goals with staff: Yes. Medical problems stabilized or resolved: Yes. Denies suicidal/homicidal  ideation: Yes. Issues/concerns per patient self-inventory: No. Other:   New problem(s) identified: None   New Short Term/Long Term Goal(s): medication stabilization, elimination of SI thoughts, development of comprehensive mental wellness plan.   Patient Goals:  work on Radiographer, therapeutic for anxiety and get medication to help with my anxiety/depression  Discharge Plan or Barriers: Patient plans to discharge home with his wife. The patient is interested in outpatient providers for medication management and therapy services. CSW will continue to follow for appropriate referrals and possible discharge planning.   Reason for Continuation of Hospitalization: Anxiety Depression Medication stabilization Suicidal ideation  Estimated Length of Stay: 01/25/2019  Attendees: Patient: 01/22/2019 10:59 AM  Physician: Dr. Neita Garnet, MD 01/22/2019 10:59 AM  Nursing: Sharl Ma.Viona Gilmore, RN 01/22/2019 10:59 AM  RN Care Manager: 01/22/2019 10:59 AM  Social Worker: Radonna Ricker, [place] 01/22/2019 10:59 AM  Recreational Therapist:  01/22/2019 10:59 AM  Other: Harriett Sine, NP 01/22/2019 10:59 AM  Other:  01/22/2019 10:59 AM  Other: 01/22/2019 10:59 AM    Scribe for Treatment Team: Marylee Floras, [place] 01/22/2019 10:59 AM

## 2019-01-22 NOTE — Progress Notes (Signed)
Adult Psychoeducational Group Note  Date:  01/22/2019 Time:  11:30 AM  Group Topic/Focus:  Goals Group:   The focus of this group is to help patients establish daily goals to achieve during treatment and discuss how the patient can incorporate goal setting into their daily lives to aide in recovery. Orientation:   The focus of this group is to educate the patient on the purpose and policies of crisis stabilization and provide a format to answer questions about their admission.  The group details unit policies and expectations of patients while admitted.  Participation Level:  Active  Participation Quality:  Appropriate  Affect:  Appropriate  Cognitive:  Alert  Insight: Appropriate  Engagement in Group:  Engaged  Modes of Intervention:  Discussion and Education  Additional Comments:    Pt participated in goals group. Pt's goal today is to work on better understanding what he needs and what triggers his anxiety. Pt states that a big trigger for him is work.  Karren Cobble 01/22/2019, 11:30 AM

## 2019-01-23 NOTE — B[facility] Group Notes (Signed)
LCSW Group Therapy Note  01/23/2019    10:00-11:00am   Type of Therapy and Topic:  Group Therapy: Early Messages Received About Anger  Participation Level:  Active   Description of Group:   In this group, patients shared and discussed the early messages received in their lives about anger through parental or other adult modeling, teaching, repression, punishment, violence, and more.  Participants identified how those childhood lessons influence even now how they usually or often react when angered.  The group discussed that anger is a secondary emotion and what may be the underlying emotional themes that come out through anger outbursts or that are ignored through anger suppression.  Finally, as a group there was a conversation about the workbook's quote that "There is nothing wrong with anger; it is just a sign something needs to change."     Therapeutic Goals: 1. Patients will identify one or more childhood message about anger that they received and how it was taught to them. 2. Patients will discuss how these childhood experiences have influenced and continue to influence their own expression or repression of anger even today. 3. Patients will explore possible primary emotions that tend to fuel their secondary emotion of anger. 4. Patients will learn that anger itself is normal and cannot be eliminated, and that healthier coping skills can assist with resolving conflict rather than worsening situations.  Summary of Patient Progress:  The patient shared that his childhood lessons about anger were negligible since there was not a lot of anger in his house.  As a result, he only becomes angry occasionally depending on the situation, and it does not last long.  Therapeutic Modalities:   Cognitive Behavioral Therapy Motivation Interviewing  Lynnell Chad  .

## 2019-01-23 NOTE — Progress Notes (Signed)
[facility] MD Progress Note  01/23/2019 2:59 PM Scott Sanchez  MRN:  [id number] Subjective: States he is feeling some improvement compared to how he felt prior to admission, still tends to ruminate/focus on work related issues.  More future oriented, stating that his wife has called his employer and started process of possible short-term disability, stating he agrees that he needs "some time off" but concerned and reporting feelings of guilt regarding how coworkers/team members involved in the project he is heading will do. At this time denies suicidal ideations.  Does not endorse medication side effects.   Objective: Have reviewed chart notes and have met with patient. 43 year old male, married, 2 children, employed as an Chief Financial Officer.  Presents for worsening depression, neurovegetative symptoms of depression, significant anxiety, recent suicidal ideations with thoughts of crashing car, episodes of head-banging.  He reports she has been under a significant amount of stress related work issues-specifically to an important work related project he is heading, and which has an Teacher, early years/pre.   Patient continues to present with an anxious affect.  There is been some improvement in affect range since admission and he does present with better eye contact and smiles appropriately at times during session.  He continues to ruminate about his job and the project he is heading.  As above, states that his wife has encouraged him to consider short-term disability and has actually contact his employer to start paperwork as needed.  He states he is ambivalent about this, does not really need some time off, but at the same time feels guilty about stepping away from the project and leaving his coworkers to work on a project without him. Responds better to reassurance, support, encouragement today.  Was able to explore the pros/cons of taking time off work and states he realizes he needs to take "better care of myself", as  his main priority.  With his expressed consent and in his presence I have spoken with his wife via phone.  She acknowledges there is been some degree of improvement compared to admission but that he is still not at baseline.  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  She does states she feels he would be best served by taking some time off work/going on short-term disability.  He denies any suicidal ideations, identifies his love for wife and children as protective factor, also states he has heard his father may be traveling from out of state to visit and is looking forward to seeing his dad. Does not endorse medication side effects.  Visible in milieu, no disruptive or agitated behaviors.  Principal Problem: MDD, consider adjustment disorder with depression and anxiety  Diagnosis: Active Problems:   MDD (major depressive disorder)  Total Time spent with patient: 20 minutes  Past Psychiatric History:   Past Medical History:  Past Medical History:  Diagnosis Date  . History of kidney stones     Past Surgical History:  Procedure Laterality Date  . EXTRACORPOREAL SHOCK WAVE LITHOTRIPSY Left 01/30/2017   Procedure: LEFT EXTRACORPOREAL SHOCK WAVE LITHOTRIPSY (ESWL);  Surgeon: Alexis Frock, MD;  Location: [facility];  Service: Urology;  Laterality: Left;  . KIDNEY STONE SURGERY  y-3  . LITHOTRIPSY  2008   Family History:  Family History  Problem Relation Age of Onset  . Diabetes Mellitus II Father   . Hyperlipidemia  Father    Family Psychiatric  History:  Social History:  Social History   Substance and Sexual Activity  Alcohol Use Yes   Comment: rarely, "once a year"     Social History   Substance and Sexual Activity  Drug Use No    Social History   Socioeconomic History  . Marital status: Married    Spouse name: Not on file  . Number of children:  Not on file  . Years of education: Not on file  . Highest education level: Not on file  Occupational History  . Occupation: Museum/gallery curator  Social Needs  . Financial resource strain: Not on file  . Food insecurity:    Worry: Not on file    Inability: Not on file  . Transportation needs:    Medical: Not on file    Non-medical: Not on file  Tobacco Use  . Smoking status: Never Smoker  . Smokeless tobacco: Never Used  Substance and Sexual Activity  . Alcohol use: Yes    Comment: rarely, "once a year"  . Drug use: No  . Sexual activity: Yes    Partners: Female    Birth control/protection: Surgical  Lifestyle  . Physical activity:    Days per week: Not on file    Minutes per session: Not on file  . Stress: Not on file  Relationships  . Social connections:    Talks on phone: Not on file    Gets together: Not on file    Attends religious service: Not on file    Active member of club or organization: Not on file    Attends meetings of clubs or organizations: Not on file    Relationship status: Not on file  Other Topics Concern  . Not on file  Social History Narrative   Exercise-- no   Additional Social History:    Pain Medications: see MAR Prescriptions: see MAR Over the Counter: see MAR History of alcohol / drug use?: No history of alcohol / drug abuse Longest period of sobriety (when/how long): N/A  Sleep: Improving  Appetite:  Fair  Current Medications: Current Facility-Administered Medications  Medication Dose Route Frequency Provider Last Rate Last Dose  . acetaminophen (TYLENOL) tablet 650 mg  650 mg Oral Q6H PRN Connye Burkitt, NP      . alum & mag hydroxide-simeth (MAALOX/MYLANTA) 200-200-20 MG/5ML suspension 30 mL  30 mL Oral Q4H PRN Connye Burkitt, NP      . escitalopram (LEXAPRO) tablet 10 mg  10 mg Oral Daily Dwan Fennel, Myer Peer, MD   10 mg at 01/23/19 0801  . feeding supplement (ENSURE ENLIVE) (ENSURE ENLIVE) liquid 237 mL  237 mL Oral BID BM Deldrick Linch,  Osias Resnick A, MD      . LORazepam (ATIVAN) tablet 0.5 mg  0.5 mg Oral Q6H PRN Oralia Criger, Myer Peer, MD   0.5 mg at 01/22/19 2216  . magnesium hydroxide (MILK OF MAGNESIA) suspension 30 mL  30 mL Oral Daily PRN Connye Burkitt, NP      . traZODone (DESYREL) tablet 50 mg  50 mg Oral QHS PRN Connye Burkitt, NP   50 mg at 01/22/19 2216    Lab Results:  Results for orders placed or performed during the hospital encounter of 01/21/19 (from the past 48 hour(s))  Urinalysis, Routine w reflex microscopic     Status: None   Collection Time: 01/21/19  6:59 PM  Result Value Ref Range   Color, Urine YELLOW YELLOW  APPearance CLEAR CLEAR   Specific Gravity, Urine 1.021 1.005 - 1.030   pH 6.0 5.0 - 8.0   Glucose, UA NEGATIVE NEGATIVE mg/dL   Hgb urine dipstick NEGATIVE NEGATIVE   Bilirubin Urine NEGATIVE NEGATIVE   Ketones, ur NEGATIVE NEGATIVE mg/dL   Protein, ur NEGATIVE NEGATIVE mg/dL   Nitrite NEGATIVE NEGATIVE   Leukocytes,Ua NEGATIVE NEGATIVE    Comment: Performed at [facility] [address], [place], [place] [address]  Urine rapid drug screen (hosp performed)not at [facility]     Status: None   Collection Time: 01/21/19  6:59 PM  Result Value Ref Range   Opiates NONE DETECTED NONE DETECTED   Cocaine NONE DETECTED NONE DETECTED   Benzodiazepines NONE DETECTED NONE DETECTED   Amphetamines NONE DETECTED NONE DETECTED   Tetrahydrocannabinol NONE DETECTED NONE DETECTED   Barbiturates NONE DETECTED NONE DETECTED    Comment: (NOTE) DRUG SCREEN FOR MEDICAL PURPOSES ONLY.  IF CONFIRMATION IS NEEDED FOR ANY PURPOSE, NOTIFY LAB WITHIN 5 DAYS. LOWEST DETECTABLE LIMITS FOR URINE DRUG SCREEN Drug Class                     Cutoff (ng/mL) Amphetamine and metabolites    1000 Barbiturate and metabolites    200 Benzodiazepine                 397 Tricyclics and metabolites     300 Opiates and metabolites        300 Cocaine and metabolites        300 THC                             50 Performed at [facility], [place] [address] [place], [place] [address]   CBC     Status: None   Collection Time: 01/22/19  6:40 AM  Result Value Ref Range   WBC 4.4 4.0 - 10.5 K/uL   RBC 5.53 4.22 - 5.81 MIL/uL   Hemoglobin 16.5 13.0 - 17.0 g/dL   HCT 50.2 39.0 - 52.0 %   MCV 90.8 80.0 - 100.0 fL   MCH 29.8 26.0 - 34.0 pg   MCHC 32.9 30.0 - 36.0 g/dL   RDW 13.2 11.5 - 15.5 %   Platelets 276 150 - 400 K/uL   nRBC 0.0 0.0 - 0.2 %    Comment: Performed at [facility], [place] [address] [place], [place] [address]  Comprehensive metabolic panel     Status: Abnormal   Collection Time: 01/22/19  6:40 AM  Result Value Ref Range   Sodium 136 135 - 145 mmol/L   Potassium 3.8 3.5 - 5.1 mmol/L   Chloride 104 98 - 111 mmol/L   CO2 22 22 - 32 mmol/L   Glucose, Bld 93 70 - 99 mg/dL   BUN 12 6 - 20 mg/dL   Creatinine, Ser 1.00 0.61 - 1.24 mg/dL   Calcium 9.3 8.9 - 10.3 mg/dL   Total Protein 6.9 6.5 - 8.1 g/dL   Albumin 4.3 3.5 - 5.0 g/dL   AST 53 (H) 15 - 41 U/L   ALT 114 (H) 0 - 44 U/L   Alkaline Phosphatase 92 38 - 126 U/L   Total Bilirubin 1.5 (H) 0.3 - 1.2 mg/dL   GFR calc non Af Amer >60 >60 mL/min   GFR calc Af Amer >60 >60 mL/min   Anion gap 10 5 - 15  Comment: Performed at [facility], [place] [address], [place], [place] [address]  Hemoglobin A1c     Status: Abnormal   Collection Time: 01/22/19  6:40 AM  Result Value Ref Range   Hgb A1c MFr Bld 4.6 (L) 4.8 - 5.6 %    Comment: (NOTE) Pre diabetes:          5.7%-6.4% Diabetes:              >6.4% Glycemic control for   <7.0% adults with diabetes    Mean Plasma Glucose 85.32 mg/dL    Comment: Performed at [facility] [address], [place], [place] [address]  Ethanol     Status: None   Collection Time: 01/22/19  6:40 AM  Result Value Ref Range   Alcohol, Ethyl (B) <10 <10 mg/dL    Comment: (NOTE) Lowest detectable limit for serum alcohol is 10 mg/dL. For  medical purposes only. Performed at [facility], [place] [address], [place], [place] [address]   Lipid panel     Status: None   Collection Time: 01/22/19  6:40 AM  Result Value Ref Range   Cholesterol 134 0 - 200 mg/dL   Triglycerides 97 <150 mg/dL   HDL 41 >40 mg/dL   Total CHOL/HDL Ratio 3.3 RATIO   VLDL 19 0 - 40 mg/dL   LDL Cholesterol 74 0 - 99 mg/dL    Comment:        Total Cholesterol/HDL:CHD Risk Coronary Heart Disease Risk Table                     Men   Women  1/2 Average Risk   3.4   3.3  Average Risk       5.0   4.4  2 X Average Risk   9.6   7.1  3 X Average Risk  23.4   11.0        Use the calculated Patient Ratio above and the CHD Risk Table to determine the patient's CHD Risk.        ATP III CLASSIFICATION (LDL):  <100     mg/dL   Optimal  100-129  mg/dL   Near or Above                    Optimal  130-159  mg/dL   Borderline  160-189  mg/dL   High  >190     mg/dL   Very High Performed at [facility] [address], [place], [place] [address]   TSH     Status: None   Collection Time: 01/22/19  6:40 AM  Result Value Ref Range   TSH 3.667 0.350 - 4.500 uIU/mL    Comment: Performed by a 3rd Generation assay with a functional sensitivity of <=0.01 uIU/mL. Performed at [facility], [place] [address], [place], [place] [address]     Blood Alcohol level:  Lab Results  Component Value Date   ETH <10 75/17/0017    Metabolic Disorder Labs: Lab Results  Component Value Date   HGBA1C 4.6 (L) 01/22/2019   MPG 85.32 01/22/2019   No results found for: PROLACTIN Lab Results  Component Value Date   CHOL 134 01/22/2019   TRIG 97 01/22/2019   HDL 41 01/22/2019   CHOLHDL 3.3 01/22/2019   VLDL 19 01/22/2019   LDLCALC 74 01/22/2019   LDLCALC 98 07/31/2018    Physical Findings: AIMS: Facial and Oral  Movements Muscles of Facial Expression: None, normal Lips and Perioral Area: None, normal Jaw: None,  normal Tongue: None, normal,Extremity Movements Upper (arms, wrists, hands, fingers): None, normal Lower (legs, knees, ankles, toes): None, normal, Trunk Movements Neck, shoulders, hips: None, normal, Overall Severity Severity of abnormal movements (highest score from questions above): None, normal Incapacitation due to abnormal movements: None, normal Patient's awareness of abnormal movements (rate only patient's report): No Awareness, Dental Status Current problems with teeth and/or dentures?: No Does patient usually wear dentures?: No  CIWA:    COWS:     Musculoskeletal: Strength & Muscle Tone: within normal limits Gait & Station: normal Patient leans: N/A  Psychiatric Specialty Exam: Physical Exam  ROS no chest pain, no shortness of breath, no vomiting  Blood pressure 114/82, pulse 87, temperature (!) 97.4 F (36.3 C), resp. rate 18, height 5' 11"  (1.803 m), weight 100.7 kg, SpO2 97 %.Body mass index is 30.96 kg/m.  General Appearance: Improving grooming  Eye Contact:  Good  Speech:  Normal Rate  Volume:  Normal  Mood:  Anxious, Depressed and Reports some improvement compared to admission,   Affect:  Affect becoming more reactive, remains anxious and ruminative, but tends to improve during session with support/encouragement  Thought Process:  Linear and Descriptions of Associations: Intact  Orientation:  Full (Time, Place, and Person)  Thought Content:  No hallucinations, no delusions, ruminative about stressors  Suicidal Thoughts:  No-he denies any suicidal or self-injurious ideations at this time, contracts for safety on unit  Homicidal Thoughts:  No-he denies any homicidal or violent ideations  Memory:  Recent and remote grossly intact  Judgement:  Fair/improving  Insight:  Fair/improving  Psychomotor Activity:  Normal-no current psychomotor restlessness or agitation  Concentration:  Concentration: Good and Attention Span: Good  Recall:  Good  Fund of Knowledge:  Good   Language:  Good  Akathisia:  Negative  Handed:  Right  AIMS (if indicated):     Assets:  Communication Skills Desire for Improvement Resilience  ADL's:  Intact  Cognition:  WNL  Sleep:  Number of Hours: 6.75   Assessment: 43 year old male, married, 2 children, employed as an Chief Financial Officer.  Presents for worsening depression, neurovegetative symptoms of depression, significant anxiety, recent suicidal ideations with thoughts of crashing car, episodes of head-banging.  He reports she has been under a significant amount of stress related work issues-specifically to an important work related project he is heading, and which has an Teacher, early years/pre.  He denies prior psychiatric admissions/ treatment .  Patient is presenting with persistent anxiety, lingering ruminations regarding his work-related stressors.  He does state he is feeling better than he did on admission and his affect is partially improved with a somewhat more reactive affect, smiling appropriately at times.  Patient's wife corroborates that there is been some improvement although she points out that patient still seems far from baseline, and significantly anxious.  She states she is hoping patient will go on short-term disability, in order to focus on self and take time off from the pressure and stress has been under.  Thus far patient is tolerating medications well,denies suicidal ideations, and identifies wife and children as protective factors against hurting self. .    Treatment Plan Summary: Daily contact with patient to assess and evaluate symptoms and progress in treatment, Medication management, Plan Inpatient treatment and Medications as below  Treatment Plan reviewed as below today 2/22 Encourage group and milieu participation to work on coping skills and symptom reduction Continue  Ativan 0.5 mg every 6 hours PRN for anxiety Continue Trazodone 50 mg nightly PRN for insomnia Continue Lexapro  10 mg daily for depression and  anxiety Treatment team working on disposition planning options.  Jenne Campus, MD 01/23/2019, 2:59 PM   Patient ID: Jeannett Senior, male   DOB: 1976-04-17, 43 y.o.   MRN: [id number]

## 2019-01-23 NOTE — Plan of Care (Signed)
D: Patient presents with blunted affect; he seems preoccupied with seeing the doctor today.  He states, "the doctor spoke to my wife while I present; he states I may stay a few more days."  explained to patient that he has signed a 72-hour discharge and he would need to rescind it if he is agreeable to staying past tomorrow.  He denies any thoughts of self harm.  He rates his depression as a 1; hopelessness as a 2; and anxiety as 4. His goal today is "to develop a discharge plan and continue to working in my workbook."  A: Continue to monitor medication management and MD orders.  Safety checks completed every 15 minutes per protocol.  Offer support and encouragement as needed.  R: Patient is receptive to staff; his behavior is appropriate.    Problem: Coping: Goal: Ability to identify and develop effective coping behavior will improve Outcome: Not Progressing   Problem: Self-Concept: Goal: Ability to identify factors that promote anxiety will improve Outcome: Not Progressing

## 2019-01-23 NOTE — [facility] Group Notes (Signed)
Adult Psychoeducational Group Note  Date:  01/23/2019 Time:  9:58 PM  Group Topic/Focus:  Wrap-Up Group:   The focus of this group is to help patients review their daily goal of treatment and discuss progress on daily workbooks.  Participation Level:  Active  Participation Quality:  Appropriate and Attentive  Affect:  Appropriate  Cognitive:  Alert and Appropriate  Insight: Appropriate and Good  Engagement in Group:  Engaged  Modes of Intervention:  Discussion and Education  Additional Comments:  Pt attended and participated in wrap up group this evening. Pt rated their day an 8/10, due to them feeling better and feeling like they did not have to take as many medications. Pt completed their goal to work on a discharge plan.   Chrisandra Netters 01/23/2019, 9:58 PM

## 2019-01-23 NOTE — Progress Notes (Signed)
D: Pt denies SI/HI/AVH. Pt is pleasant and cooperative. Pt stated he was anxious earlier, but got better this evening.  A: Pt was offered support and encouragement. Pt was given scheduled medications. Pt was encourage to attend groups. Q 15 minute checks were done for safety.   R:Pt attends groups and interacts well with peers and staff. Pt is taking medication. Pt has no complaints.Pt receptive to treatment and safety maintained on unit.   Problem: Self-Concept: Goal: Ability to identify factors that promote anxiety will improve Outcome: Progressing   Problem: Self-Concept: Goal: Level of anxiety will decrease Outcome: Progressing   Problem: Activity: Goal: Interest or engagement in activities will improve Outcome: Progressing   Problem: Activity: Goal: Sleeping patterns will improve Outcome: Progressing   Problem: Coping: Goal: Ability to demonstrate self-control will improve Outcome: Progressing

## 2019-01-23 NOTE — [facility] Group Notes (Signed)
[facility] Group Notes:  (Nursing)  Date:  01/23/2019  Time: 1:15 PM Type of Therapy:  Nurse Education  Participation Level:  Active  Participation Quality:  Appropriate and Attentive  Affect:  Appropriate  Cognitive:  Alert  Insight:  Appropriate  Engagement in Group:  Engaged  Modes of Intervention:  Discussion  Summary of Progress/Problems: Identifying Needs  Shela Nevin 01/23/2019, 6:09 PM

## 2019-01-23 NOTE — Progress Notes (Signed)
Adult Psychoeducational Group Note  Date:  01/23/2019 Time: 9:05am-10:05am   Group Topic/Focus:  Goals Group:   The focus of this group is to help patients establish daily goals to achieve during treatment and discuss how the patient can incorporate goal setting into their daily lives to aide in recovery.  Participation Level:  Active  Participation Quality:  Appropriate, Attentive and Sharing  Affect:  Appropriate  Cognitive:  Alert, Appropriate and Oriented  Insight: Appropriate  Engagement in Group:  Engaged and Supportive  Modes of Intervention:  Discussion and Support  Additional Comments:  Patient informed the group that his goal for the day is to try to work on his discharge plan and focus on the things that are truly important around him, focus on family, and address work that doesn't have to just take from him. Patient informed the group that he would rate the day as an 8. Patient informed the group that he is looking forward to talking through issues and talk to others.   Annye Asa 01/23/2019, 6:34 PM

## 2019-01-24 ENCOUNTER — Encounter (HOSPITAL_COMMUNITY): Payer: Self-pay | Admitting: Behavioral Health

## 2019-01-24 MED ORDER — TRAZODONE HCL 50 MG PO TABS: 50.0000 mg | ORAL_TABLET | Freq: Every evening | ORAL | 0 refills | Status: DC | PRN

## 2019-01-24 MED ORDER — ESCITALOPRAM OXALATE 10 MG PO TABS: 10.0000 mg | ORAL_TABLET | Freq: Every day | ORAL | 0 refills | Status: DC

## 2019-01-24 MED ORDER — LORAZEPAM 0.5 MG PO TABS: 0.5000 mg | ORAL_TABLET | Freq: Four times a day (QID) | ORAL | 0 refills | Status: DC | PRN

## 2019-01-24 NOTE — Progress Notes (Signed)
  [facility] Adult Case Management Discharge Plan :  Will you be returning to the same living situation after discharge:  Yes,  with wife and children At discharge, do you have transportation home?: Yes,  family Do you have the ability to pay for your medications: Yes,  states there are no barriers  Release of information consent forms completed and turned in to Medical Records by CSW.   Patient to Follow up at: Follow-up Information    [facility] on 02/02/2019.   Why:  Therapy appointment with Leta Jungling is Tuesday, 3/3 at 8:30a.  Medication management appointment with Elon Jester is Thursday, 3/5 at 1:30p.  Please call within 24 hours of discharge to hold appointments and pay the $20 deposit.   Contact information: [address] [place] [address] [phone]           Next level of care provider has access to [facility] Link:no  Safety Planning and Suicide Prevention discussed: Yes,  with wife  Have you used any form of tobacco in the last 30 days? (Cigarettes, Smokeless Tobacco, Cigars, and/or Pipes): No  Has patient been referred to the Quitline?: N/A patient is not a smoker  Patient has been referred for addiction treatment: N/A  Lynnell Chad, LCSW 01/24/2019, 9:45 AM

## 2019-01-24 NOTE — [facility] Suicide Risk Assessment (Signed)
[facility] INPATIENT:  Family/Significant Other Suicide Prevention Education  Suicide Prevention Education:  Education Completed; wife, Scott Sanchez [phone]),  (name of family member/significant other) has been identified by the patient as the family member/significant other with whom the patient will be residing, and identified as the person(s) who will aid the patient in the event of a mental health crisis (suicidal ideations/suicide attempt).  With written consent from the patient, the family member/significant other has been provided the following suicide prevention education, prior to the and/or following the discharge of the patient.  The suicide prevention education provided includes the following:  Suicide risk factors  Suicide prevention and interventions  National Suicide Hotline telephone number  [facility] assessment telephone number  [facility] Emergency Assistance 911  [facility] and/or Residential Mobile Crisis Unit telephone number  Request made of family/significant other to:  Remove weapons (e.g., guns, rifles, knives), all items previously/currently identified as safety concern.    Remove drugs/medications (over-the-counter, prescriptions, illicit drugs), all items previously/currently identified as a safety concern.  The family member/significant other verbalizes understanding of the suicide prevention education information provided.  The family member/significant other agrees to remove the items of safety concern listed above.  Per CSW handoff for weekend staff, this phone call was completed by weekday CSW.  Scott Sanchez 01/24/2019, 9:49 AM

## 2019-01-24 NOTE — Progress Notes (Signed)
[facility] MD Progress Note  01/24/2019 1:36 PM JIANNI BATTEN  MRN:  [id number] Subjective: Patient endorses partial improvement compared to how he felt prior to admission.  He does endorse continued/persistent anxiety related to his job-related stressors mainly, but states that he is feeling less hopeless and having more control regarding options and strategies to mitigate anxiety.  He reports that both his father and his wife have recommended that he take some time off/consider going on short-term disability.  He states he thinks his boss also supports this.  He acknowledges some further imbalance about this option, mainly as he feels he would be leaving coworkers to deal with a difficult project without his help and due to feeling that he could lose  the respect of his coworkers. States, however, "I am thinking it is a good idea to take some time off work", " I think I need time to work on my anxiety and myself". Denies suicidal ideations and describes partial improvement compared to admission, today more focused on discharge planning and hopeful for discharge soon.   Objective: Have reviewed chart notes and have met with patient. 43 year old male, married, 2 children, employed as an Chief Financial Officer.  Presents for worsening depression, neurovegetative symptoms of depression, significant anxiety, recent suicidal ideations with thoughts of crashing car, episodes of head-banging.  He reports she has been under a significant amount of stress related work issues-specifically to an important work related project he is heading, and which has an Teacher, early years/pre.  Patient endorses improving mood compared to admission. Denies suicidal ideations. Endorses persistent depression, and as above, endorses some ongoing ambivalence and ruminations about work and option of taking some time off . Does state he is leaning towards taking time off, which has been encouraged by his wife and family, and seems to be less severely  distressed about work stressors today.  Denies medication side effects thus far . As he improves he is becoming more focused on discharge planning. We have discussed options-encouraged him to consider possible referral to IOP level of care.   Principal Problem: MDD, consider adjustment disorder with depression and anxiety  Diagnosis: Active Problems:   MDD (major depressive disorder)  Total Time spent with patient: 20 minutes  Past Psychiatric History:   Past Medical History:  Past Medical History:  Diagnosis Date  . History of kidney stones     Past Surgical History:  Procedure Laterality Date  . EXTRACORPOREAL SHOCK WAVE LITHOTRIPSY Left 01/30/2017   Procedure: LEFT EXTRACORPOREAL SHOCK WAVE LITHOTRIPSY (ESWL);  Surgeon: Alexis Frock, MD;  Location: [facility];  Service: Urology;  Laterality: Left;  . KIDNEY STONE SURGERY  y-3  . LITHOTRIPSY  2008   Family History:  Family History  Problem Relation Age of Onset  . Diabetes Mellitus II Father   . Hyperlipidemia Father    Family Psychiatric  History:  Social History:  Social History   Substance and Sexual Activity  Alcohol Use Yes   Comment: rarely, "once a year"     Social History   Substance and Sexual Activity  Drug Use No    Social History   Socioeconomic History  . Marital status: Married    Spouse name: Not on file  . Number of children: Not on file  . Years of education: Not on file  . Highest education level: Not on file  Occupational History  . Occupation: Museum/gallery curator  Social Needs  . Financial resource strain: Not on file  . Food insecurity:  Worry: Not on file    Inability: Not on file  . Transportation needs:    Medical: Not on file    Non-medical: Not on file  Tobacco Use  . Smoking status: Never Smoker  . Smokeless tobacco: Never Used  Substance and Sexual Activity  . Alcohol use: Yes    Comment: rarely, "once a year"  . Drug use: No  . Sexual activity: Yes    Partners: Female     Birth control/protection: Surgical  Lifestyle  . Physical activity:    Days per week: Not on file    Minutes per session: Not on file  . Stress: Not on file  Relationships  . Social connections:    Talks on phone: Not on file    Gets together: Not on file    Attends religious service: Not on file    Active member of club or organization: Not on file    Attends meetings of clubs or organizations: Not on file    Relationship status: Not on file  Other Topics Concern  . Not on file  Social History Narrative   Exercise-- no   Additional Social History:    Pain Medications: see MAR Prescriptions: see MAR Over the Counter: see MAR History of alcohol / drug use?: No history of alcohol / drug abuse Longest period of sobriety (when/how long): N/A  Sleep: Improving  Appetite:  Improving  Current Medications: Current Facility-Administered Medications  Medication Dose Route Frequency Provider Last Rate Last Dose  . acetaminophen (TYLENOL) tablet 650 mg  650 mg Oral Q6H PRN Connye Burkitt, NP      . alum & mag hydroxide-simeth (MAALOX/MYLANTA) 200-200-20 MG/5ML suspension 30 mL  30 mL Oral Q4H PRN Connye Burkitt, NP      . escitalopram (LEXAPRO) tablet 10 mg  10 mg Oral Daily Cobos, Myer Peer, MD   10 mg at 01/24/19 1025  . feeding supplement (ENSURE ENLIVE) (ENSURE ENLIVE) liquid 237 mL  237 mL Oral BID BM Cobos, Fernando A, MD      . LORazepam (ATIVAN) tablet 0.5 mg  0.5 mg Oral Q6H PRN Cobos, Myer Peer, MD   0.5 mg at 01/24/19 0811  . magnesium hydroxide (MILK OF MAGNESIA) suspension 30 mL  30 mL Oral Daily PRN Connye Burkitt, NP      . traZODone (DESYREL) tablet 50 mg  50 mg Oral QHS PRN Connye Burkitt, NP   50 mg at 01/23/19 2104    Lab Results:  No results found for this or any previous visit (from the past 48 hour(s)).  Blood Alcohol level:  Lab Results  Component Value Date   ETH <10 85/27/7824    Metabolic Disorder Labs: Lab Results  Component Value Date   HGBA1C  4.6 (L) 01/22/2019   MPG 85.32 01/22/2019   No results found for: PROLACTIN Lab Results  Component Value Date   CHOL 134 01/22/2019   TRIG 97 01/22/2019   HDL 41 01/22/2019   CHOLHDL 3.3 01/22/2019   VLDL 19 01/22/2019   LDLCALC 74 01/22/2019   LDLCALC 98 07/31/2018    Physical Findings: AIMS: Facial and Oral Movements Muscles of Facial Expression: None, normal Lips and Perioral Area: None, normal Jaw: None, normal Tongue: None, normal,Extremity Movements Upper (arms, wrists, hands, fingers): None, normal Lower (legs, knees, ankles, toes): None, normal, Trunk Movements Neck, shoulders, hips: None, normal, Overall Severity Severity of abnormal movements (highest score from questions above): None, normal Incapacitation due to abnormal  movements: None, normal Patient's awareness of abnormal movements (rate only patient's report): No Awareness, Dental Status Current problems with teeth and/or dentures?: No Does patient usually wear dentures?: No  CIWA:    COWS:     Musculoskeletal: Strength & Muscle Tone: within normal limits Gait & Station: normal Patient leans: N/A  Psychiatric Specialty Exam: Physical Exam  ROS no chest pain, no shortness of breath, no vomiting  Blood pressure 117/80, pulse 92, temperature 98.5 F (36.9 C), temperature source Oral, resp. rate 16, height 5' 11"  (1.803 m), weight 100.7 kg, SpO2 97 %.Body mass index is 30.96 kg/m.  General Appearance: Improving grooming  Eye Contact:  Good  Speech:  Normal Rate  Volume:  Normal  Mood:  Describes improving mood, seems less depressed although still sad, anxious.  Affect noted to be more reactive,   Affect:  Affect becoming more reactive, still significantly anxious but more responsive to support/encouragement, smiles at times appropriately  Thought Process:  Linear and Descriptions of Associations: Intact  Orientation:  Full (Time, Place, and Person)  Thought Content:  No hallucinations, no delusions,  still anxious about work-related stressors but less intensely ruminative, presents more future oriented  Suicidal Thoughts:  No-he denies any suicidal or self-injurious ideations at this time, contracts for safety on unit  Homicidal Thoughts:  No-he denies any homicidal or violent ideations  Memory:  Recent and remote grossly intact  Judgement:  Fair/improving  Insight:  Fair/improving  Psychomotor Activity:  Normal-no current psychomotor restlessness or agitation  Concentration:  Concentration: Good and Attention Span: Good  Recall:  Good  Fund of Knowledge:  Good  Language:  Good  Akathisia:  Negative  Handed:  Right  AIMS (if indicated):     Assets:  Communication Skills Desire for Improvement Resilience  ADL's:  Intact  Cognition:  WNL  Sleep:  Number of Hours: 6.75   Assessment: 43 year old male, married, 2 children, employed as an Chief Financial Officer.  Presents for worsening depression, neurovegetative symptoms of depression, significant anxiety, recent suicidal ideations with thoughts of crashing car, episodes of head-banging.  He reports she has been under a significant amount of stress related work issues-specifically to an important work related project he is heading, and which has an Teacher, early years/pre.  He denies prior psychiatric admissions/ treatment .  Today patient presents with partial improvement compared to admission.  Noted to report improving mood, less depression.  Denies suicidal ideations.  Does continue to present with a vaguely anxious and constricted affect but affect is fuller in range.  He continues to ruminate about work-related stressors and ambivalence about whether to take some time off work after discharge but seems less distressed about this decision and states he feels he will follow his family's encouragement to take some time off work in order to focus on himself.  Thus far tolerating medications well, denies side effects.  He had submitted letter requesting  discharge, but today is rescinding said letter for further inpatient care/management.     Treatment Plan Summary: Daily contact with patient to assess and evaluate symptoms and progress in treatment, Medication management, Plan Inpatient treatment and Medications as below  Treatment Plan reviewed as below today 2/23 Encourage group and milieu participation to work on coping skills and symptom reduction Continue Ativan 0.5 mg every 6 hours PRN for anxiety Continue Trazodone 50 mg nightly PRN for insomnia Continue Lexapro  10 mg daily for depression and anxiety Treatment team working on disposition planning options.  Jenne Campus, MD 01/24/2019,  1:36 PM   Patient ID: CUTHBERT TURTON, male   DOB: 1976/10/07, 43 y.o.   MRN: [id number]

## 2019-01-24 NOTE — Progress Notes (Signed)
Date:  01/24/2019  Time:  1300  Type of Therapy:  Nurse Education/Shalita, RN  Participation Level:  Active  Participation Quality:  Appropriate  Affect:  Appropriate  Cognitive:  Appropriate  Insight:  Appropriate  Engagement in Group:  Engaged  Modes of Intervention:  Discussion and Education   

## 2019-01-24 NOTE — Plan of Care (Signed)
D: Pt denies SI/HI/AVH . Pt is pleasant and cooperative. Pt stated he was doing ok, still a little anxious, but has been trying to utilize breathing techniques .  A: Pt was offered support and encouragement. Pt was given scheduled medications. Pt was encourage to attend groups. Q 15 minute checks were done for safety.  R:Pt attends groups and interacts well with peers and staff. Pt is taking medication. Pt has no complaints.Pt receptive to treatment and safety maintained on unit.  Problem: Self-Concept: Goal: Ability to identify factors that promote anxiety will improve Outcome: Progressing   Problem: Education: Goal: Emotional status will improve Outcome: Progressing

## 2019-01-24 NOTE — Plan of Care (Signed)
  Problem: Education: Goal: Emotional status will improve Outcome: Progressing Goal: Verbalization of understanding the information provided will improve Outcome: Progressing   Problem: Activity: Goal: Interest or engagement in activities will improve Outcome: Progressing   D: Pt alert and oriented on the unit. Pt engaging with RN staff and other pts. Pt denies SI/HI, A/VH. Pt rated his depression, feelings of hopelessness, and anxiety a 3, with 10 being the worst. Pt's goal for today is "to finalize discharge and find inner strengths for self reliance." Pt was cooperative and participated during unit groups and activities.  A: Education, support and encouragement provided, q15 minute safety checks remain in effect. Medications administered per MD orders.  R: No reactions/side effects to medicine noted. Pt denies any concerns at this time, and verbally contracts for safety. Pt ambulating on the unit with no issues. Pt remains safe on and off the unit.

## 2019-01-24 NOTE — [facility] Group Notes (Signed)
[facility] LCSW Group Therapy Note  01/24/2019   10:00-11:00AM  Type of Therapy and Topic:  Group Therapy:  Unhealthy versus Healthy Supports, Which Am I?  Participation Level:  Active   Description of Group:  Patients in this group were introduced to the concept that additional supports including self-support are an essential part of recovery.  Initially a discussion was held about the differences between healthy versus unhealthy supports.  Patients were asked to share what unhealthy supports in their lives need to be addressed, as well as what additional healthy supports could be added for greater help in reaching their goals.   A song entitled "My Own Hero" was played and a group discussion ensued in which patients stated they could relate to the song and it inspired them to realize they have be willing to help themselves in order to succeed, because other people cannot achieve sobriety or stability for them.  We discussed adding a variety of healthy supports to address the various needs in patient lives, including becoming more self-supportive.  Therapeutic Goals: 1)  Highlight the differences between healthy and unhealthy supports 2)  Suggest the importance of being a part of one's own support system 2)  Discuss reasons people in one's life may eventually be unable to be continually supportive  3)  Identify the patient's current support system and   4)  elicit commitments to add healthy supports and to become more conscious of being self-supportive   Summary of Patient Progress:  The patient expressed that the unhealthy support which needs to be addressed includes work, particularly co-workers/processes/communication that are problematic; he also shared that he himself is often an unhealthy self-support.  Healthy supports which could be added for increased stability and happiness include Intensive Outpatient program and follow-up with psychiatric providers upon discharge..  Therapeutic Modalities:    Motivational Interviewing Activity  Lynnell Chad

## 2019-01-25 MED ORDER — TRAZODONE HCL 50 MG PO TABS: 50.0000 mg | ORAL_TABLET | Freq: Every evening | ORAL | 0 refills | Status: DC | PRN

## 2019-01-25 MED ORDER — LORAZEPAM 0.5 MG PO TABS: 0.5000 mg | ORAL_TABLET | Freq: Four times a day (QID) | ORAL | 0 refills | Status: DC | PRN

## 2019-01-25 MED ORDER — ESCITALOPRAM OXALATE 10 MG PO TABS: 10.0000 mg | ORAL_TABLET | Freq: Every day | ORAL | 0 refills | Status: DC

## 2019-01-25 NOTE — Progress Notes (Signed)
D: Pt A & O X 4. Denies SI, HI, AVH and pain at this time. Rates his depression 3/10, hopelessness 1/10 and anxiety 5/10 on self inventory sheet. Reports he slept fair last night with good appetite, normal mood and good concentration level. Pt's goal for today is to "start my discharge plan". Pt D/C home as ordered.  Picked up in the lobby by his wife.  A: D/C instructions reviewed with pt including prescriptions and follow up appointments; compliance encouraged. All belongings from locker # 7 given to pt at time of departure. Scheduled medications given with verbal education and effects monitored. Safety checks maintained without incident till time of d/c.  R: Pt receptive to care. Compliant with medications when offered. Denies adverse drug reactions when assessed. Verbalized understanding related to d/c instructions. Signed belonging sheet in agreement with items received from locker. Ambulatory with a steady gait. Appears to be in no physical distress at time of departure.

## 2019-01-25 NOTE — Discharge Summary (Addendum)
Physician Discharge Summary Note  Patient:  Scott Sanchez is an 43 y.o., male  MRN:  [id number]  DOB:  08/15/1976  Patient phone:  [phone] (home)   Patient address:   [address] [place] [address],   Total Time spent with patient: Greater than 30 minutes  Date of Admission:  01/21/2019  Date of Discharge: 01-26-19  Reason for Admission: Worsening symptoms of depression, anxiety triggered by work related stress.  Principal Problem: MDD (major depressive disorder)  Discharge Diagnoses: Principal Problem:   MDD (major depressive disorder)  Past Psychiatric History: Major depressive disorder.  Past Medical History:  Past Medical History:  Diagnosis Date  . History of kidney stones     Past Surgical History:  Procedure Laterality Date  . EXTRACORPOREAL SHOCK WAVE LITHOTRIPSY Left 01/30/2017   Procedure: LEFT EXTRACORPOREAL SHOCK WAVE LITHOTRIPSY (ESWL);  Surgeon: Sebastian Ache, MD;  Location: [facility];  Service: Urology;  Laterality: Left;  . KIDNEY STONE SURGERY  y-3  . LITHOTRIPSY  2008   Family History:  Family History  Problem Relation Age of Onset  . Diabetes Mellitus II Father   . Hyperlipidemia Father    Family Psychiatric  History: See H&P  Social History:  Social History   Substance and Sexual Activity  Alcohol Use Yes   Comment: rarely, "once a year"     Social History   Substance and Sexual Activity  Drug Use No    Social History   Socioeconomic History  . Marital status: Married    Spouse name: Not on file  . Number of children: Not on file  . Years of education: Not on file  . Highest education level: Not on file  Occupational History  . Occupation: Advertising account planner  Social Needs  . Financial resource strain: Not on file  . Food insecurity:    Worry: Not on file    Inability: Not on file  . Transportation needs:    Medical: Not on file    Non-medical: Not on file  Tobacco Use  . Smoking status: Never Smoker  .  Smokeless tobacco: Never Used  Substance and Sexual Activity  . Alcohol use: Yes    Comment: rarely, "once a year"  . Drug use: No  . Sexual activity: Yes    Partners: Female    Birth control/protection: Surgical  Lifestyle  . Physical activity:    Days per week: Not on file    Minutes per session: Not on file  . Stress: Not on file  Relationships  . Social connections:    Talks on phone: Not on file    Gets together: Not on file    Attends religious service: Not on file    Active member of club or organization: Not on file    Attends meetings of clubs or organizations: Not on file    Relationship status: Not on file  Other Topics Concern  . Not on file  Social History Narrative   Exercise-- no   Hospital Course: (Per Md's admission evaluation): 43 year old male , presented to the hospital voluntarily, at the encouragement of his PCP. States he has been feeling anxious and depressed over the last few weeks, which he attributes to significant stressors, mainly work related . 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 States " the pressure has been so much, I find myself withdrawing more than engaging ". Over  the last week he has been experiencing some suicidal ideations, with thoughts of crashing his vehicle.  He also has had episodes where he has banged head on a wall due to feeling intensely anxious. Endorses neuro-vegetative symptoms as below. States insomnia has been a major issue and states " even though I go to bed at 8 pm every night, I am getting 3 hours of sleep". In addition to neuro-vegetative symptoms also describes increased anxiety and frequent panic symptoms.   Scott Sanchez is a 43 yo Caucasian male, married, lives with his mom/children. Was a walk-in admit to the [facility] for evaluation of worsening symptoms of depression & anxiety. This is his first psychiatric hospitalization although had received mental  health care about 20 years after his parents marriage ended. On account of this admission, he reported that his depression & anxiety were triggered by work related stress. He reported that he has been feeling stressed & overwhelmed about his job responsibilities. He was having passive  thoughts of suicide without any specific plans or intent. However, he reported wishing while driving to wreck & die that way. He was recommended for inpatient psychiatric admission, treatments & monitoring.  After evaluation of his presenting symptoms, the medication regimen targeting those symptoms were discussed & initiated with his consent. Scott Sanchez was medicated & discharged on the medications as listed on the discharge medications lists below. He was enrolled & participated in the group counseling sessions being offered & held on this unit. He learned coping skills. He presented no other significant pre-existing medical issues that required treatment & or monitoring. He tolerated his treatment regimen without any adverse effects or reactions reported. His symptoms responded well to his treatment regimen. This is evidenced by his reports of improved mood, sleep & presentation of good affect.   Scott Sanchez is seen today for discharge. He has normal anxiety about going home. He is not overwhelmed by this feeling. He is looking forward to working on his mental health issues. He is not expressing any delusions today. He denies any hallucination. Feels in control of himself. No passivity of thought/ will. No active or passive fantasy about suicide lately. No suicidal thoughts. Looking forward to getting back to his life & job. No thoughts of violence. He does not feel depressed. No evidence of mania.   The nursing staff reports that patient has been appropriate on the unit. Patient has been interacting well with peers. No behavioral issues. Patient has not voiced any suicidal thoughts. Patient has not been observed to be internally  stimulated or preoccupied. Patient has been adherent with treatment recommendations. Patient has been tolerating hi medication well. No reported adverse effects or reactions.    Patient was discussed at the treatment team meeting this morning. The team members feel that patient is back to his baseline level of function. Th team agrees with plan to discharge patient today to continue mental health care & medication management on an outpatient basis as noted below as noted below. He was provided with all the necessary information needed to make this appointment without problems. He was able to engage in safety planning including plan to return to [facility] or contact emergency services if he feels unable to maintain hisown safety or the safety of others. Pt had no further questions, comments or concerns. He left [facility] with all personal belongings in no apparent distress.Transportation per wife.  Physical Findings: AIMS: Facial and Oral Movements Muscles of Facial Expression: None, normal Lips and Perioral Area: None, normal Jaw: None,  normal Tongue: None, normal,Extremity Movements Upper (arms, wrists, hands, fingers): None, normal Lower (legs, knees, ankles, toes): None, normal, Trunk Movements Neck, shoulders, hips: None, normal, Overall Severity Severity of abnormal movements (highest score from questions above): None, normal Incapacitation due to abnormal movements: None, normal Patient's awareness of abnormal movements (rate only patient's report): No Awareness, Dental Status Current problems with teeth and/or dentures?: No Does patient usually wear dentures?: No  CIWA:    COWS:     Musculoskeletal: Strength & Muscle Tone: within normal limits Gait & Station: normal Patient leans: N/A  Psychiatric Specialty Exam: Physical Exam  Nursing note and vitals reviewed. Constitutional: He appears well-developed.  HENT:  Head: Normocephalic.  Eyes: Pupils are equal, round, and reactive to light.   Neck: Normal range of motion.  Cardiovascular: Normal rate.  Respiratory: Effort normal.  GI: Soft.  Genitourinary:    Genitourinary Comments: Deferred   Musculoskeletal: Normal range of motion.  Neurological: He is alert.  Skin: Skin is warm.    Review of Systems  Constitutional: Negative.   HENT: Negative.   Eyes: Negative.   Respiratory: Negative.  Negative for cough and shortness of breath.   Cardiovascular: Negative.  Negative for chest pain and palpitations.  Gastrointestinal: Negative.  Negative for abdominal pain, heartburn, nausea and vomiting.  Genitourinary: Negative.   Musculoskeletal: Negative.  Negative for myalgias.  Skin: Negative.   Neurological: Negative.  Negative for dizziness and headaches.  Endo/Heme/Allergies: Negative.   Psychiatric/Behavioral: Positive for depression (Stabilized with medication prior to discharge). Negative for hallucinations, memory loss, substance abuse and suicidal ideas. The patient has insomnia (Stabilized with medication prior to discharge). The patient is not nervous/anxious (Stable).     Blood pressure 119/84, pulse 96, temperature 98.2 F (36.8 C), temperature source Oral, resp. rate 16, height  (1.803 m), weight 100.7 kg, SpO2 97 %.Body mass index is 30.96 kg/m.  See Md's discharge SRA   Have you used any form of tobacco in the last 30 days? (Cigarettes, Smokeless Tobacco, Cigars, and/or Pipes): No  Has this patient used any form of tobacco in the last 30 days? (Cigarettes, Smokeless Tobacco, Cigars, and/or Pipes)  N/A  Blood Alcohol level:  Lab Results  Component Value Date   ETH <10 01/22/2019   Metabolic Disorder Labs:  Lab Results  Component Value Date   HGBA1C 4.6 (L) 01/22/2019   MPG 85.32 01/22/2019   No results found for: PROLACTIN Lab Results  Component Value Date   CHOL 134 01/22/2019   TRIG 97 01/22/2019   HDL 41 01/22/2019   CHOLHDL 3.3 01/22/2019   VLDL 19 01/22/2019   LDLCALC 74 01/22/2019    LDLCALC 98 07/31/2018   See Psychiatric Specialty Exam and Suicide Risk Assessment completed by Attending Physician prior to discharge.  Discharge destination:  Home  Is patient on multiple antipsychotic therapies at discharge:  No   Has Patient had three or more failed trials of antipsychotic monotherapy by history:  No  Recommended Plan for Multiple Antipsychotic Therapies: NA  Allergies as of 01/25/2019   No Known Allergies     Medication List    TAKE these medications     Indication  escitalopram 10 MG tablet Commonly known as:  LEXAPRO Take 1 tablet (10 mg total) by mouth daily. For depression  Indication:  Major Depressive Disorder   LORazepam 0.5 MG tablet Commonly known as:  ATIVAN Take 1 tablet (0.5 mg total) by mouth every 6 (six) hours as needed for anxiety.  Indication:  Feeling Anxious   traZODone 50 MG tablet Commonly known as:  DESYREL Take 1 tablet (50 mg total) by mouth at bedtime as needed for sleep.  Indication:  Trouble Sleeping      Follow-up Information    [facility] Follow up on 02/02/2019.   Why:  Therapy appointment with Leta Jungling is Tuesday, 3/3 at 8:30a.  Medication management appointment with Elon Jester is Thursday, 3/5 at 1:30p.  Please call within 24 hours of discharge to hold appointments and pay the $20 deposit.   Contact information: [address] [place] [address] [phone]        [facility] OUTPATIENT THERAPY [place]. Go on 01/28/2019.   Specialty:  Behavioral Health Why:  Initial appointment for IOP services is Thursday, 01/28/2019 at 8:30am. Please be sure to bring any discharge paperwork from this hospitalization. Jeri Modena will give you a personal call to provide additional information for your appointment.  Contact information: [address] [id number] mc [place] [address] [phone]         Follow-up recommendations: Activity:  As tolerated Diet: As recommended  by your primary care doctor. Keep all scheduled follow-up appointments as recommended.   Comments: Patient is instructed prior to discharge to: Take all medications as prescribed by his/her mental healthcare provider. Report any adverse effects and or reactions from the medicines to his/her outpatient provider promptly. Patient has been instructed & cautioned: To not engage in alcohol and or illegal drug use while on prescription medicines. In the event of worsening symptoms, patient is instructed to call the crisis hotline, 911 and or go to the nearest ED for appropriate evaluation and treatment of symptoms. To follow-up with his/her primary care provider for your other medical issues, concerns and or health care needs.   Signed: Armandina Stammer, NP, PMHNP, FNP-BC 01/26/2019, 11:51 AM  Patient seen, Suicide Assessment Completed.  Disposition Plan Reviewed

## 2019-01-25 NOTE — Progress Notes (Addendum)
  [facility] Adult Case Management Discharge Plan :  Will you be returning to the same living situation after discharge:  Yes,  patient reports he is returning home with his wife and children At discharge, do you have transportation home?: Yes,  patient's wife will pick him up at discharge Do you have the ability to pay for your medications: Yes,  [employer] and income from employment  Release of information consent forms completed and in the chart;  Patient's signature needed at discharge.  Patient to Follow up at: Follow-up Information    [facility] Follow up on 02/02/2019.   Why:  Therapy appointment with Leta Jungling is Tuesday, 3/3 at 8:30a.  Medication management appointment with Elon Jester is Thursday, 3/5 at 1:30p.  Please call within 24 hours of discharge to hold appointments and pay the $20 deposit.   Contact information: [address] [place] [address] [phone]        BEHAVIORAL HEALTH OUTPATIENT THERAPY [place]. Go on 01/28/2019.   Specialty:  Behavioral Health Why:  Initial appointment for IOP services is Thursday, 01/28/2019 at 8:30am. Please be sure to bring any discharge paperwork from this hospitalization. Jeri Modena will give you a personal call to provide additional information for your appointment.  Contact information: [address] [id number] mc [place] [address] [phone]          Next level of care provider has access to [facility] Link:yes  Safety Planning and Suicide Prevention discussed: Yes,  with the patient's wife  Have you used any form of tobacco in the last 30 days? (Cigarettes, Smokeless Tobacco, Cigars, and/or Pipes): No  Has patient been referred to the Quitline?: N/A patient is not a smoker  Patient has been referred for addiction treatment: N/A  Maeola Sarah, LCSWA 01/25/2019, 1:20 PM

## 2019-01-25 NOTE — Progress Notes (Signed)
Recreation Therapy Notes  Date:  2. 2. 20 Time: 0930 Location: [address] Dayroom  Group Topic: Stress Management  Goal Area(s) Addresses:  Patient will identify positive stress management techniques. Patient will identify benefits of using stress management post d/c.  Intervention: Worksheet  Activity :  Choice Meditation.  LRT introduced the stress management technique of meditation.  LRT played a meditation that focused on having a choice in changing the way they think about things.  Patients were to follow along as meditation played to engage in activity.  Education:  Stress Management, Discharge Planning.   Education Outcome: Acknowledges Education  Clinical Observations/Feedback: Pt did not attend group.     Caroll Rancher, LRT/CTRS         Lillia Abed, Frankye Schwegel A 01/25/2019 11:10 AM

## 2019-01-25 NOTE — [facility] Suicide Risk Assessment (Addendum)
[facility] Discharge Suicide Risk Assessment   Principal Problem: Depression, Anxiety  Discharge Diagnoses: Active Problems:   MDD (major depressive disorder)   Total Time spent with patient: 30 minutes  Musculoskeletal: Strength & Muscle Tone: within normal limits Gait & Station: normal Patient leans: N/A  Psychiatric Specialty Exam: ROS no chest pain, no shortness of breath, no nausea, no vomiting , no diarrhea, no rash, no fever or chills  Blood pressure 119/84, pulse 96, temperature 98.2 F (36.8 C), temperature source Oral, resp. rate 16, height 5\' 11"  (1.803 m), weight 100.7 kg, SpO2 97 %.Body mass index is 30.96 kg/m.  General Appearance: Well Groomed  Eye Contact::  Good  Speech:  Normal Rate409  Volume:  Normal  Mood:  describes mood has improved, and currently states " I feel more positive", describes mood as 8/10, with 10 being best   Affect:  less anxious, affect more reactive, smiles at times appropriately   Thought Process:  Linear and Descriptions of Associations: Intact  Orientation:  Full (Time, Place, and Person)  Thought Content:  no hallucinations, no delusions   Suicidal Thoughts:  No denies suicidal or self injurious ideations, denies homicidal or violent ideations  Homicidal Thoughts:  No  Memory:  recent and remote grossly intact   Judgement:  Other:  improving   Insight:  improving   Psychomotor Activity:  Normal  Concentration:  Good  Recall:  Good  Fund of Knowledge:Good  Language: Good  Akathisia:  Negative  Handed:  Right  AIMS (if indicated):     Assets:  Desire for Improvement Resilience  Sleep:  Number of Hours: 6.75  Cognition: WNL  ADL's:  Intact   Mental Status Per Nursing Assessment::   On Admission:  Suicidal ideation indicated by patient, Self-harm thoughts, Suicidal ideation indicated by others  Demographic Factors:  43, married, two children, employed   Loss Factors: Work related stressors  Historical Factors: No history of  prior psychiatric admissions, no history of suicide attempt, no history of severe depressive episodes in the past   Risk Reduction Factors:   Responsible for children under 58 years of age, Sense of responsibility to family, Employed, Living with another person, especially a relative and Positive coping skills or problem solving skills  Continued Clinical Symptoms:  At this time patient is alert, attentive, no psychomotor agitation or restlessness, mood described as improved , affect less anxious, but more reactive, brighter, no thought disorder, no suicidal ideations, no self injurious ideations, no homicidal or violent ideations, no psychotic symptoms. Currently future oriented, planning on taking some time off work and interested in IOP referral. Behavior on unit in good control, no disruptive behaviors, pleasant on approach. With patient's express consent I spoke with his wife via phone , who corroborates that patient has improved and is in agreement with discharge today.  Cognitive Features That Contribute To Risk:  No gross cognitive deficits noted upon discharge. Is alert , attentive, and oriented x 3   Suicide Risk:  Mild:  Suicidal ideation of limited frequency, intensity, duration, and specificity.  There are no identifiable plans, no associated intent, mild dysphoria and related symptoms, good self-control (both objective and subjective assessment), few other risk factors, and identifiable protective factors, including available and accessible social support.  Follow-up Information    [facility] Follow up on 02/02/2019.   Why:  Therapy appointment with Leta Jungling is Tuesday, 3/3 at 8:30a.  Medication management appointment with Elon Jester is Thursday, 3/5 at 1:30p.  Please call within  24 hours of discharge to hold appointments and pay the $20 deposit.   Contact information: [address] [place] [address] [phone]           Plan Of Care/Follow-up  recommendations:  Activity:  as tolerated  Diet:  regular Tests:  NA Other:  See below Patient is expressing readiness for discharge, there are no current grounds for involuntary commitment . He is leaving unit in good spirits . Plans to return home Plans to follow up as above, also expressing interest in IOP referral. Has an established PCP at [facility] for medical issues as needed - he is aware that his LFTs have been slightly elevated, and plans to follow up with PCP for monitoring and management if needed  Craige Cotta, MD 01/25/2019, 9:32 AM

## 2019-02-02 ENCOUNTER — Ambulatory Visit (INDEPENDENT_AMBULATORY_CARE_PROVIDER_SITE_OTHER): Payer: 59 | Admitting: *Deleted

## 2019-02-02 DIAGNOSIS — Z23 Encounter for immunization: Secondary | ICD-10-CM | POA: Diagnosis not present

## 2019-02-02 NOTE — Progress Notes (Signed)
Patient here for 3rd hep a/b vaccine  Vaccine given and patient tolerated well.

## 2019-02-03 ENCOUNTER — Encounter (HOSPITAL_COMMUNITY): Payer: Self-pay | Admitting: Psychiatry

## 2019-02-03 ENCOUNTER — Other Ambulatory Visit (HOSPITAL_COMMUNITY): Payer: 59 | Attending: Psychiatry | Admitting: Licensed Clinical Social Worker

## 2019-02-03 DIAGNOSIS — F329 Major depressive disorder, single episode, unspecified: Secondary | ICD-10-CM | POA: Diagnosis present

## 2019-02-03 DIAGNOSIS — F320 Major depressive disorder, single episode, mild: Secondary | ICD-10-CM | POA: Insufficient documentation

## 2019-02-03 DIAGNOSIS — F411 Generalized anxiety disorder: Secondary | ICD-10-CM | POA: Insufficient documentation

## 2019-02-03 DIAGNOSIS — Z79899 Other long term (current) drug therapy: Secondary | ICD-10-CM | POA: Insufficient documentation

## 2019-02-03 DIAGNOSIS — R45851 Suicidal ideations: Secondary | ICD-10-CM | POA: Diagnosis not present

## 2019-02-03 DIAGNOSIS — Z833 Family history of diabetes mellitus: Secondary | ICD-10-CM | POA: Diagnosis not present

## 2019-02-03 NOTE — Progress Notes (Signed)
Psychiatric Initial Adult Assessment   Patient Identification: Scott Sanchez MRN:  [id number] Date of Evaluation:  02/03/2019 Referral Source: Inpatient admission  Chief Complaint:  Depression and Anxeity Visit Diagnosis: No diagnosis found.  History of Present Illness:  Per discharge note from a recent inpatient  Admission: 43 year old male , presented to the hospital voluntarily, at the encouragement of his PCP. States he has been feeling anxious and depressed over the last few weeks, which he attributes to significant stressors, mainly work related . 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 States " the pressure has been so much, I find myself withdrawing more than engaging ". Over the last week he has been experiencing some suicidal ideations, with thoughts of crashing his vehicle. He also has had episodes where he has banged head on a wall due to feeling intensely anxious. Endorses neuro-vegetative symptoms as below. States insomnia has been a major issue and states " even though I go to bed at 8 pm every night, I am getting 3 hours of sleep". In addition to neuro-vegetative symptoms also describes increased anxiety and frequent panic symptoms.  Evaluation:  Scott Sanchez was evaluated for admission to intensive outpatient programming.  Patient presents pleasant, anxious and depressed.  Denies suicidal or homicidal ideations during this assessment.  Patient ongoing feelings of benign a  "disapontiment or failure" for his employer and family.    States overwhelming stressors relating to current increased work demand and benign present for his  family.  Patient validates above assessment during his inpatient admission.  States his mood has improved since his discharge however continues to report symptoms of worries. Report taking medications as directed.  Scott Sanchez reports ongoing interrupted sleep with some improvement with  trazodone 50 mg  .  Discussed titration of medication however reports he [facility].  Patient admitted to intensive outpatient programming on 02/03/2019  Associated Signs/Symptoms: Depression Symptoms:  depressed mood, feelings of worthlessness/guilt, difficulty concentrating, anxiety, (Hypo) Manic Symptoms:  Distractibility, Anxiety Symptoms:  Excessive Worry, Panic Symptoms, Social Anxiety, Psychotic Symptoms:  Hallucinations: None PTSD Symptoms: NA  Past Psychiatric History: Denied, reported "some therapy" in the past after his parents divorce.  Previous Psychotropic Medications: No   Substance Abuse History in the last 12 months:  No.  Consequences of Substance Abuse: NA  Past Medical History:  Past Medical History:  Diagnosis Date  . History of kidney stones     Past Surgical History:  Procedure Laterality Date  . EXTRACORPOREAL SHOCK WAVE LITHOTRIPSY Left 01/30/2017   Procedure: LEFT EXTRACORPOREAL SHOCK WAVE LITHOTRIPSY (ESWL);  Surgeon: Sebastian Ache, MD;  Location: [facility];  Service: Urology;  Laterality: Left;  . KIDNEY STONE SURGERY  y-3  . LITHOTRIPSY  2008    Family Psychiatric History:  Family History:  Family History  Problem Relation Age of Onset  . Diabetes Mellitus II Father   . Hyperlipidemia Father     Social History:   Social History   Socioeconomic History  . Marital status: Married    Spouse name: Not on file  . Number of children: Not on file  . Years of education: Not on file  . Highest education level: Not on file  Occupational History  . Occupation: Advertising account planner  Social Needs  . Financial resource strain: Not on file  . Food insecurity:    Worry: Not  on file    Inability: Not on file  . Transportation needs:    Medical: Not on file    Non-medical: Not on file  Tobacco Use  . Smoking status: Never Smoker  . Smokeless tobacco: Never Used  Substance and Sexual  Activity  . Alcohol use: Yes    Comment: rarely, "once a year"  . Drug use: No  . Sexual activity: Yes    Partners: Female    Birth control/protection: Surgical  Lifestyle  . Physical activity:    Days per week: Not on file    Minutes per session: Not on file  . Stress: Not on file  Relationships  . Social connections:    Talks on phone: Not on file    Gets together: Not on file    Attends religious service: Not on file    Active member of club or organization: Not on file    Attends meetings of clubs or organizations: Not on file    Relationship status: Not on file  Other Topics Concern  . Not on file  Social History Narrative   Exercise-- no    Additional Social History:   Allergies:  No Known Allergies  Metabolic Disorder Labs: Lab Results  Component Value Date   HGBA1C 4.6 (L) 01/22/2019   MPG 85.32 01/22/2019   No results found for: PROLACTIN Lab Results  Component Value Date   CHOL 134 01/22/2019   TRIG 97 01/22/2019   HDL 41 01/22/2019   CHOLHDL 3.3 01/22/2019   VLDL 19 01/22/2019   LDLCALC 74 01/22/2019   LDLCALC 98 07/31/2018   Lab Results  Component Value Date   TSH 3.667 01/22/2019    Therapeutic Level Labs: No results found for: LITHIUM No results found for: CBMZ No results found for: VALPROATE  Current Medications: Current Outpatient Medications  Medication Sig Dispense Refill  . escitalopram (LEXAPRO) 10 MG tablet Take 1 tablet (10 mg total) by mouth daily. For depression 30 tablet 0  . LORazepam (ATIVAN) 0.5 MG tablet Take 1 tablet (0.5 mg total) by mouth every 6 (six) hours as needed for anxiety. 12 tablet 0  . traZODone (DESYREL) 50 MG tablet Take 1 tablet (50 mg total) by mouth at bedtime as needed for sleep. 30 tablet 0   No current facility-administered medications for this visit.     Musculoskeletal: Strength & Muscle Tone: within normal limits Gait & Station: normal Patient leans: N/A  Psychiatric Specialty Exam: Review of  Systems  Psychiatric/Behavioral: Positive for depression. Negative for suicidal ideas. The patient is nervous/anxious and has insomnia.   All other systems reviewed and are negative.   There were no vitals taken for this visit.There is no height or weight on file to calculate BMI.  General Appearance: Casual and Fairly Groomed  Eye Contact:  Good  Speech:  Clear and Coherent  Volume:  Normal  Mood:  Anxious  Affect:  Congruent  Thought Process:  Coherent  Orientation:  Full (Time, Place, and Person)  Thought Content:  Logical  Suicidal Thoughts:  No however reported passive ideations prior to inpatient admission  Homicidal Thoughts:  No  Memory:  NA  Judgement:  Fair  Insight:  Fair  Psychomotor Activity:  NA  Concentration:  Concentration: Fair  Recall:  Fair  Fund of Knowledge:Fair  Language: Good  Akathisia:  No  Handed:  Right  AIMS (if indicated):    Assets:  Communication Skills Desire for Improvement Resilience Social Support  ADL's:  Intact  Cognition: WNL  Sleep:  Fair   Screenings: AIMS     [facility] (Dis[facility]  AIMS Total Score  0    AUDIT     Admission (Discharged) [facility]  Alcohol Use Disorder Identification Test Final Score (AUDIT)  0    PHQ2-9     Office Visit from 01/21/2019 in [facility] Office Visit from 07/31/2018 in [facility] Office Visit from 07/25/2017 in [facility] Office Visit from 07/04/2016 in [facility] Office Visit from 03/30/2013 in [facility] Total Score  6  1  0  0  0  PHQ-9 Total Score  25  -  -  -  -      Assessment and Plan:  Admitted  to Intensive Outpatient Programing   Continue  Lexapro 10 mg, Trazodone 50 mg and Ativan 0.5mg  PRN. Will consider titration with current regimen however reports he will wait until his f/u.  - patient [facility]- pending results will follow-up with patient for medication titration/adherances    Treatment plan was reviewed and agreed upon by NP T.Melvyn Neth and patient Scott Sanchez need for group services.     Oneta Rack, NP 3/4/20209:45 AM

## 2019-02-03 NOTE — Progress Notes (Signed)
Comprehensive Clinical Assessment (CCA) Note  02/03/2019 IBROHIM BUCCIERI [id number]  Visit Diagnosis:      ICD-10-CM   1. GAD (generalized anxiety disorder) F41.1   2. Current mild episode of major depressive disorder without prior episode (HCC) F32.0       CCA Part One  Part One has been completed on paper by the patient.  (See scanned document in Chart Review)  CCA Part Two A  Intake/Chief Complaint:  CCA Intake With Chief Complaint CCA Part Two Date: 02/03/19 CCA Part Two Time: 1346 Chief Complaint/Presenting Problem: This is a 43 yr old, married, employed, Caucasian male who was transitioned from the inpatient unit at [facility].  Pt was admitted there from  01-21-19 thru 01-26-19; treatment for worsening anxiety and depressive symptoms with SI (plan: wreck car).  Pt denies any prior suicide attempts/gestures.  Currently denies SI.  Also, denies HI or A/V hallucinations.  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  States that three people report to him.  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  I've been working a lot of long hours.  There are a lot of demands and my performance has gone down."  Pt also reports there's been a lot of new coworkers.  Pt states he found himself thinking about work all the time; even now although he has been out on short term disability.  Denies any prior psychiatric admissions.  At age 72 saw a therapist when his parents were divorcing.  Family hx:  Great GF (alcoholic).                                      Patients Currently Reported Symptoms/Problems: Sadness, anxious, ruminating thoughts, poor concentration, panic attacks, poor appetite and sleep, irritable, anhedonia, poor energy Collateral Involvement: Reports wife of 21 yrs is supportive along with his mother (she resides in [place] [facility]). Individual's Strengths: "I am a hardworker." Individual's Abilities: Ablility to jump in and put all his efforts into a project. Type of  Services Patient Feels Are Needed: MH-IOP  Mental Health Symptoms Depression:  Depression: Change in energy/activity, Difficulty Concentrating, Fatigue, Increase/decrease in appetite, Irritability, Sleep (too much or little)  Mania:  Mania: N/A  Anxiety:   Anxiety: Difficulty concentrating, Irritability, Restlessness, Sleep, Tension, Worrying, Fatigue  Psychosis:  Psychosis: N/A  Trauma:  Trauma: N/A  Obsessions:  Obsessions: N/A  Compulsions:  Compulsions: N/A  Inattention:  Inattention: N/A  Hyperactivity/Impulsivity:  Hyperactivity/Impulsivity: N/A  Oppositional/Defiant Behaviors:  Oppositional/Defiant Behaviors: N/A  Borderline Personality:  Emotional Irregularity: N/A  Other Mood/Personality Symptoms:      Mental Status Exam Appearance and self-care  Stature:  Stature: Average  Weight:  Weight: Average weight  Clothing:  Clothing: Casual  Grooming:  Grooming: Normal  Cosmetic use:  Cosmetic Use: None  Posture/gait:  Posture/Gait: Normal  Motor activity:  Motor Activity: Not Remarkable  Sensorium  Attention:  Attention: Normal  Concentration:  Concentration: Preoccupied  Orientation:  Orientation: X5  Recall/memory:  Recall/Memory: Normal  Affect and Mood  Affect:  Affect: Anxious  Mood:  Mood: Depressed  Relating  Eye contact:  Eye Contact: Normal  Facial expression:  Facial Expression: Anxious  Attitude toward examiner:  Attitude Toward Examiner: Cooperative  Thought and Language  Speech flow: Speech Flow: Normal  Thought content:  Thought Content: Appropriate to mood and circumstances  Preoccupation:     Hallucinations:  Organization:     Company secretary of Knowledge:  Fund of Knowledge: Average  Intelligence:  Intelligence: Average  Abstraction:  Abstraction: Normal  Judgement:  Judgement: Normal  Reality Testing:  Reality Testing: Adequate  Insight:  Insight: Good  Decision Making:  Decision Making: Normal  Social Functioning  Social Maturity:   Social Maturity: Responsible  Social Judgement:  Social Judgement: Normal  Stress  Stressors:     Coping Ability:     Skill Deficits:     Supports:      Family and Psychosocial History: Family history Marital status: Married Number of Years Married: 21 What types of issues is patient dealing with in the relationship?: Reports wife is very supportive. Are you sexually active?: Yes What is your sexual orientation?: heterosexual Does patient have children?: Yes How many children?: 2 How is patient's relationship with their children?: 4 yr old son and 47 yr old daughter  Childhood History:  Childhood History By whom was/is the patient raised?: Mother, Mother/father and step-parent Additional childhood history information: Born in [place], [place].  Moved around a lot.  Parents divorced twice.  Parents divorced when pt was age [age].  Pt states that his mother left his father for stepfather.  Pt lived with mother.  According to pt, he was a socially awkard child.  "I was always excluded from activities; even today my coworkers will get together after work and not invite me."  Pt denies any trauma or abuse.                             Description of patient's relationship with caregiver when they were a child: Patient reports having a good relationship with his mother and step father during his childhood. Patient reports that his father moved to [place] after he and the patient's mother divorced.  Patient's description of current relationship with people who raised him/her: Patient reports he and his mother continue to have a good relationship. He states that he and his father have a distant relationship, however they are cordial with one another.  How were you disciplined when you got in trouble as a child/adolescent?: "I didnt really get into trouble" Does patient have siblings?: No Did patient suffer any verbal/emotional/physical/sexual abuse as a child?: No Did patient suffer from severe  childhood neglect?: No Has patient ever been sexually abused/assaulted/raped as an adolescent or adult?: No Was the patient ever a victim of a crime or a disaster?: No Witnessed domestic violence?: No Has patient been effected by domestic violence as an adult?: No  CCA Part Two B  Employment/Work Situation: Employment / Work Psychologist, occupational Employment situation: Employed 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, retail  How long has patient been employed?: 19 years  Patient's job has been impacted by current illness: Yes Describe how patient's job has been impacted: Unable to complete tasks due to anxiety  What is the longest time patient has a held a job?: 19 years  Where was the patient employed at that time?: Current job  Did You Receive Any Psychiatric Treatment/Services While in Equities trader?: No Are There Guns or Other Weapons in Your Home?: No  Education: Education Did Garment/textile technologist From [employer]?: Yes Did Theme park manager?: Yes What Type of College Degree Do you Have?: MS Did You Attend Graduate School?: Yes What is Your Post Graduate Degree?: MS What Was Your Major?: Engineering Did You Have An Individualized  Education Program (IIEP): No Did You Have Any Difficulty At School?: No  Religion: Religion/Spirituality Are You A Religious Person?: No  Leisure/Recreation: Leisure / Recreation Leisure and Hobbies: "Learning Chinese and other electronic projects"  Exercise/Diet: Exercise/Diet Do You Exercise?: No Have You Gained or Lost A Significant Amount of Weight in the Past Six Months?: No Do You Follow a Special Diet?: No Do You Have Any Trouble Sleeping?: Yes Explanation of Sleeping Difficulties: Difficulty staying asleep  CCA Part Two C  Alcohol/Drug Use: Alcohol / Drug Use Pain Medications: see MAR Prescriptions: see MAR Over the Counter: see MAR History of alcohol / drug use?: No history of alcohol / drug abuse Longest period  of sobriety (when/how long): N/A                      CCA Part Three  ASAM's:  Six Dimensions of Multidimensional Assessment  Dimension 1:  Acute Intoxication and/or Withdrawal Potential:     Dimension 2:  Biomedical Conditions and Complications:     Dimension 3:  Emotional, Behavioral, or Cognitive Conditions and Complications:     Dimension 4:  Readiness to Change:     Dimension 5:  Relapse, Continued use, or Continued Problem Potential:     Dimension 6:  Recovery/Living Environment:      Substance use Disorder (SUD)    Social Function:  Social Functioning Social Maturity: Responsible Social Judgement: Normal  Stress:     Risk Assessment- Self-Harm Potential:    Risk Assessment -Dangerous to Others Potential:    DSM5 Diagnoses: Patient Active Problem List   Diagnosis Date Noted  . Suicidal behavior without attempted self-injury 01/21/2019  . MDD (major depressive disorder) 01/21/2019  . Lymphocytosis 09/18/2017  . Left flank pain 07/25/2017  . Persistent headaches 07/25/2017  . Preventative health care 06/23/2015  . Ankle pain, right 12/15/2013  . Equinus deformity of foot, acquired 12/15/2013  . Metatarsal deformity 12/15/2013  . Tenosynovitis of foot and ankle 12/15/2013  . KNEE PAIN, RIGHT 12/04/2009  . RENAL CALCULUS, LEFT 09/29/2008  . ABDOMINAL PAIN RIGHT UPPER QUADRANT 09/22/2008  . HEMATURIA, HX OF 09/22/2008    Patient Centered Plan: Patient is on the following Treatment Plan(s):  Anxiety and Depression  Recommendations for Services/Supports/Treatments:    Treatment Plan Summary: OP Treatment Plan Summary: Pt will participate in MH-IOP in order to learn effective coping skills to reduce depressive symptoms.  Oriented pt to MH-IOP.  Provided pt with an orientation folder.  [facility]  Encouraged support groups.  Referrals to Alternative Service(s): Referred to Alternative Service(s):   Place:    Date:   Time:    Referred to Alternative Service(s):   Place:   Date:   Time:    Referred to Alternative Service(s):   Place:   Date:   Time:    Referred to Alternative Service(s):   Place:   Date:   Time:     Jeri Modena, M.Ed,CNA

## 2019-02-04 ENCOUNTER — Other Ambulatory Visit (HOSPITAL_COMMUNITY): Payer: 59 | Admitting: Licensed Clinical Social Worker

## 2019-02-04 DIAGNOSIS — F320 Major depressive disorder, single episode, mild: Secondary | ICD-10-CM | POA: Diagnosis not present

## 2019-02-04 DIAGNOSIS — F411 Generalized anxiety disorder: Secondary | ICD-10-CM

## 2019-02-05 ENCOUNTER — Other Ambulatory Visit (HOSPITAL_COMMUNITY): Payer: 59 | Admitting: Licensed Clinical Social Worker

## 2019-02-05 DIAGNOSIS — F411 Generalized anxiety disorder: Secondary | ICD-10-CM

## 2019-02-05 DIAGNOSIS — F320 Major depressive disorder, single episode, mild: Secondary | ICD-10-CM | POA: Diagnosis not present

## 2019-02-08 ENCOUNTER — Other Ambulatory Visit (HOSPITAL_COMMUNITY): Payer: 59 | Admitting: Licensed Clinical Social Worker

## 2019-02-08 DIAGNOSIS — F411 Generalized anxiety disorder: Secondary | ICD-10-CM

## 2019-02-08 DIAGNOSIS — F320 Major depressive disorder, single episode, mild: Secondary | ICD-10-CM | POA: Diagnosis not present

## 2019-02-08 NOTE — Progress Notes (Signed)
    Daily Group Progress Note  Program: IOP  Group Time: 9am-12pm  Participation Level: Active  Behavioral Response: Appropriate  Type of Therapy:  Group Therapy; process group, psycho-educational group  Summary of Progress:  The purpose of this group is to utilize CBT and DBT skills in a group setting to increase use of healthy coping skills and decrease frequency and intensity of active mental health symtpoms.  9am-11am Clinician checked in with group members, assessing for SI/HI/psychosis and overall level of functioning. Clinician and group members completed icebreaker activity with potiential journal topics focused on opportunities for self reflection. Clinician presented the topic of vulnerability. Clinician inquired what clients initially associated with vulnerability. Clinician pointed out and praised moments of vulnerability in group throughout the week. Clinician provided Dewain Penning video on ' The price of invulnerability.' Clinician facilitated processing of thoughts and feelings related to topics in the video and any changes in perception related to vulnerability in self. Clinicians and group members discussed barriers to showing vulnerability as well as the use of vulnerability to gain connection with others improving support systems and overall mental health.  11am-12pm Mindfulness group was co-facilitated focusing on mindfulness skills using breathing and yoga. Client engaged in group activities and discussions. Client identifies anxiety related to home and work setting. Client shows progress toward goal as evidence by identifying pros and cons to vulnerability and his wife being a positive support system.  Harlon Ditty, LCSW

## 2019-02-09 ENCOUNTER — Other Ambulatory Visit (HOSPITAL_COMMUNITY): Payer: 59 | Admitting: Licensed Clinical Social Worker

## 2019-02-09 DIAGNOSIS — F320 Major depressive disorder, single episode, mild: Secondary | ICD-10-CM | POA: Diagnosis not present

## 2019-02-09 DIAGNOSIS — F411 Generalized anxiety disorder: Secondary | ICD-10-CM

## 2019-02-10 ENCOUNTER — Other Ambulatory Visit: Payer: Self-pay

## 2019-02-10 ENCOUNTER — Other Ambulatory Visit (HOSPITAL_COMMUNITY): Payer: 59 | Admitting: Licensed Clinical Social Worker

## 2019-02-10 DIAGNOSIS — F320 Major depressive disorder, single episode, mild: Secondary | ICD-10-CM | POA: Diagnosis not present

## 2019-02-10 DIAGNOSIS — F411 Generalized anxiety disorder: Secondary | ICD-10-CM

## 2019-02-10 NOTE — Progress Notes (Signed)
    Daily Group Progress Note  Program: IOP  Group Time: 9am-12pm  Participation Level: Active  Behavioral Response: Appropriate, Sharing and Motivated  Type of Therapy:  Group Therapy; process group, psycho-educational group  Summary of Progress:  The purpose of this group is to utilize CBT and DBT skills in a group setting to increase use of healthy coping skills and decrease frequency and intensity of active mental health symptoms.  9am-10:30am Clinician checked in with group members, assessing for SI/HI/psychosis and overall level of functioning. Clinician provided check in questions related to todays topic of healthy communication. Clinician and group members processed responses related to finding purpose, roles of family, language and culture. Clinician actively listened to group members, validating feelings utilizing reflective and summarizing statements. Clinician and group members discussed 4 types of communication styles and what each might look like. Clinician and group members discussed the role of non-verbal communication in interactions, and the possible benefits or drawbacks based on face to face or digital interactions.  10:30am-12pm Clinician and group members reviewed communication roadblocks. Clinician and group members discussed utilizing I-statements and questions to ensure both parties have the same information, active listening, and setting boundaries by clearly saying 'no.' Clinician provided psycho-educational information on DBT Communication skills DEAR MAN, GIVE, and FAST. Clinician and group members role played scenarios for each skill. Clinician praised group members engagement and inquired about self care activity for the day.  Client engaged in discussions and provided feedback to group members. Client shows progress toward goals as evidence by identifying where improvements can be made in his life. Client notes working on improving active listening and maintaining  better balance between work and family time.  Harlon Ditty, LCSW

## 2019-02-10 NOTE — Progress Notes (Signed)
    Daily Group Progress Note  Program: IOP  Group Time: 9am-12pm  Participation Level: Active  Behavioral Response: Appropriate  Type of Therapy:  Group Therapy; psycho-educational group, process group  Summary of Progress:  The purpose of this group is to utilize CBT and DBT skills in a group setting to increase use of healthy coping skills and decrease frequency and intensity of active mental health symtpoms.  9am-10:30am Clinician presented Happy Brain: How to Overcome Our Neural Predispositions to Suffering by Dr. Verna Czech.  Clinician presented psycho-educational information on automatic thinking and neural predispositions. Group members discussed negative adaptations including mind wandering, negativity bias. Educational information also included skills to improve moments of happiness by focusing on gratitude skills, non-judgmental stance, and reframing challenges to focus on compassion, acceptance, meaning, and forgiveness. Clinician and group members discussed recent life stressors and how these skills could be applied.  10:30am-12pm Clinician presented the topic of mindfulness in conjunction with psycho-educational materials focused vs wandering modes of thinking. Clinician and group members discussed core features of mindfulness and barriers for implementing. Clinician and group members practiced skill in session by participating in Savage meditation, with a focus on bringing the wandering mind back to the task in progress. Clinician and group members practiced being non-judgmental and participating fully by engaging in 'loaded questions' activity and showing moments of vulnerability which can be used as connection with others. Client engaged in all group discussions and activities. Client notes he is working on being more mindful in conversations and actively listening, especially with his wife.  Harlon Ditty, LCSW

## 2019-02-11 ENCOUNTER — Other Ambulatory Visit: Payer: Self-pay

## 2019-02-11 ENCOUNTER — Other Ambulatory Visit (HOSPITAL_COMMUNITY): Payer: 59 | Admitting: Licensed Clinical Social Worker

## 2019-02-11 DIAGNOSIS — F411 Generalized anxiety disorder: Secondary | ICD-10-CM

## 2019-02-11 DIAGNOSIS — F320 Major depressive disorder, single episode, mild: Secondary | ICD-10-CM | POA: Diagnosis not present

## 2019-02-11 NOTE — Progress Notes (Signed)
    Daily Group Progress Note  Program: IOP  Group Time: 9am-12pm  Participation Level: Active  Behavioral Response: Appropriate  Type of Therapy:??Group Therapy; process group, psycho-educational group  Summary of Progress:? The purpose of this group is to utilize CBT and DBT skills in a group setting to increase use of healthy coping skills and decrease frequency and intensity of active mental health symptoms.  9am-10:30am Clinician checked in with group members, assessing for SI/HI/psychosis and overall level of functioning. Clinician inquired about comepleted self care activy from the previous day. Clinician facilitated discussion on feeling lost, risk taking with a focus on sharing personal problems  10:30am-12pm Clinician presented the topic of distorted thinking traps and how to address. Clinicain reminded clients of CBT triangle and connection of thoughts, felings, and behaviors. Clinician reviewed mind traps and ways to challenge types of thinking. Clinicain and group members role played creating more helpful and realistic thoughts. Clinician validated frustrations with change taking time. Clinician encouraged clients to utilize previously learned I-statements to identify specific thoughts related to uncomfortable feelings in order to challenge thoughts. Clinician checked out requesting clients share a positive trait about themselves and a self care activity planned for the day. Client engaged in discussions and activities this day.  Harlon Ditty, LCSW

## 2019-02-11 NOTE — Progress Notes (Signed)
    Daily Group Progress Note  Program: IOP  Group Time: 9am-12pm  Participation Level: Active  Behavioral Response: Appropriate  Type of Therapy:  Group Therapy; psycho-educational group, process group  Summary of Progress:  The purpose of this group is to utilize CBT and DBT skills in a group setting to increase use of healthy coping skills and decrease frequency and intensity of active mental health symptoms.  9am-10:30am Clinician checked in with group members, assessing for SI/HI/psychosis and overall level of functioning. Clinician inquired about completed self-care activity from the previous day. Psycho-educational portion of group led by pharmacist. Pharmacist provided psychoeducation on classes of medications and symptoms addressed. Time was allowed for clients to ask questions of pharmacist.  10:30am-12pm Clinician presented the topic of [place] Mind with a task to improve focus. Clinician and group members discussed Emotional, Rational, and Wise mind, and the effect of decision making based on each. Clinician and group members discussed where each could be useful or harmful. Clinician presented the topic of maintaining focus, expanding on the content of what the group tended to think about at times of focus loss. Clinician reminded clients of CBT triangle and connection of thoughts, feelings, and behaviors. Clinician validated the universal tendency of individuals to mind wander and emphasized the importance of assessing their thoughts for thought traps and the amount of work necessary to reverse the effects of negative thoughts. Clinician presented strategies to maintain focus and facilitated discussion with clients about their successes and personal applicability. Clinician checked out requesting clients share a self-care activity planned for the day. Client has made progress in therapy as evidenced by his ability to disclose his concerns with focus and the role it plays in his symptoms  of anxiety. Client described his inability to stay on task and tendency to become distracted. Client expressed concern about his work environment and his use of boundarie. Client cites his wife as a good resource to keep him on task in the household. Client disclosed his preference for the reasonable mind and describes his ability to "push" his emotions away. Client continues to struggle with emotional expression.    Harlon Ditty, LCSW

## 2019-02-11 NOTE — Progress Notes (Signed)
    Daily Group Progress Note  Program: IOP  Group Time: 9am-12pm  Participation Level: Active  Behavioral Response: Appropriate, Sharing and Motivated  Type of Therapy:  Group Therapy; psycho-educational group, process group  Summary of Progress:  The purpose of this group is to utilize CBT and DBT skills in a group setting to increase use of healthy coping skills and decrease frequency and intensity of active mental health symptoms.  9am-10:30am Clinician checked in with group members, assessing for SI/HI/psychosis and overall level of functioning. Clinician inquired about completed self care activities from previous day. Clinician presented the topic of Mind Traps. Clinician reviewed with clients the connection between thoughts, feelings, and behaviors. Clinician emphasized that mind traps are learned ways of reacting to events in life, much like other patterns or habits and it is possible for [place] to learn new ways of thinking that help [place] avoid many of the emotional upheavals caused by our own mind traps. Clinician and group members reviewed each mind trap, provided examples from daily life, and practiced challenging each trap. Clinician and group members processed the impact of mind traps on mental health recovery. Clinician presented Progressive Muscle Relaxation activity for group members to participate. Clinician and group members compared this with yoga and the pebble meditation.  10:30am-12pm Clinician presented Roadblocks to Healthy Thinking that can interfere with change and contribute to relapse. Clinician and group members processed what Roadblocks have in common, why we use these, and the impact these have on relationships and mental health recovery. Clinician and group members utilized personal examples of how to address each type of roadblock and practice developing new thinking habits. Clinician validated client feelings that changing thought processing can be frustrating and is a  long term solution with repetitive practice. Clinician and group members reviewed Thinking and Behavior Cycle and focused on ways to break cycle of permission statements and unhelpful behaviors. Clinician checked out with group members planned self care activity for the day.  Client engaged in group discussions and activities. Client reports the possibility of using a shorter progressive muscle relaxation during the day to manage level of stress. Client identifies some of the roadblocks which he has been working on, specifically with work life balance.  Harlon Ditty, LCSW

## 2019-02-12 ENCOUNTER — Other Ambulatory Visit (HOSPITAL_COMMUNITY): Payer: 59 | Admitting: Licensed Clinical Social Worker

## 2019-02-12 ENCOUNTER — Other Ambulatory Visit: Payer: Self-pay

## 2019-02-12 DIAGNOSIS — F411 Generalized anxiety disorder: Secondary | ICD-10-CM

## 2019-02-12 DIAGNOSIS — F320 Major depressive disorder, single episode, mild: Secondary | ICD-10-CM | POA: Diagnosis not present

## 2019-02-13 ENCOUNTER — Encounter (HOSPITAL_COMMUNITY): Payer: Self-pay | Admitting: Family

## 2019-02-13 MED ORDER — TRAZODONE HCL 50 MG PO TABS: 50.0000 mg | ORAL_TABLET | Freq: Every evening | ORAL | 0 refills | Status: DC | PRN

## 2019-02-13 NOTE — Progress Notes (Signed)
Medication refill was provided with trazodone 50 mg p.o. nightly as per patient request

## 2019-02-15 ENCOUNTER — Other Ambulatory Visit: Payer: Self-pay

## 2019-02-15 ENCOUNTER — Other Ambulatory Visit (HOSPITAL_COMMUNITY): Payer: 59 | Admitting: Licensed Clinical Social Worker

## 2019-02-15 DIAGNOSIS — F411 Generalized anxiety disorder: Secondary | ICD-10-CM

## 2019-02-15 DIAGNOSIS — F320 Major depressive disorder, single episode, mild: Secondary | ICD-10-CM | POA: Diagnosis not present

## 2019-02-15 NOTE — Progress Notes (Signed)
    Daily Group Progress Note  Program: IOP  Group Time: 9am-12pm  Participation Level: Active  Behavioral Response: Appropriate  Type of Therapy:  Group Therapy; process group, psycho-educational group  Summary of Progress: The purpose of this group is to utilize CBT and DBT skills in a group setting to increase use of healthy coping skills and decrease frequency and intensity of active mental health symptoms.  9am-10:30am Clinician checked in with group members, assessing for SI/HI/psychosis. Clinician and group members discussed stressors and skills from the evening. Clinician actively listened to clients, utilizing summarizing and re-framing statements. Clinician and group members processed difficulties asking for help based on cultural background as well as existing stigmas. Clinician and group members processed options for finding understanding support systems.  10:30am-12pm Clinician presented Laughter Yoga to clients with psycho-education as well as in session activities. Clinician praised clients willingness to use mindfulness and engage in alternative forms of mindfulness. Clinician and group members discussed different intensities of feelings and vulnerability. Clinician and group members discussed positive affirmations and their level of belief based on previous experiences. Clinician concluded group with asking client to identify positive self care activity planned for the week. Client engaged in discussions and activities. Client did not find laughter yoga as a skill to use regularly, but did see the benefit of an enjoyable moment. Client continues to work on organizing to [employer] and plans for returning to work. Client endorses taking medications as prescribed and completing hobbies such as reading, currently about boundaries.  Harlon Ditty, LCSW

## 2019-02-16 ENCOUNTER — Other Ambulatory Visit: Payer: Self-pay

## 2019-02-16 ENCOUNTER — Encounter (HOSPITAL_COMMUNITY): Payer: Self-pay | Admitting: Family

## 2019-02-16 ENCOUNTER — Encounter (HOSPITAL_COMMUNITY): Payer: Self-pay

## 2019-02-16 ENCOUNTER — Encounter (HOSPITAL_COMMUNITY): Payer: Self-pay | Admitting: Psychiatry

## 2019-02-16 ENCOUNTER — Other Ambulatory Visit (HOSPITAL_COMMUNITY): Payer: 59 | Admitting: Licensed Clinical Social Worker

## 2019-02-16 DIAGNOSIS — F320 Major depressive disorder, single episode, mild: Secondary | ICD-10-CM

## 2019-02-16 DIAGNOSIS — F411 Generalized anxiety disorder: Secondary | ICD-10-CM

## 2019-02-16 MED ORDER — TRAZODONE HCL 50 MG PO TABS: 50.0000 mg | ORAL_TABLET | Freq: Every day | ORAL | 0 refills | Status: DC

## 2019-02-16 NOTE — Progress Notes (Signed)
  [facility] Discharge Summary  Scott Sanchez [id number]  Admission date: 03/042020 Discharge date: 02/16/2019  Reason for admission:Per assessment note:Per discharge note from a recent inpatient  Admission: 43 year old male , presented to the hospital voluntarily, at the encouragement of his PCP. States he has been feeling anxious and depressed over the last few weeks, which he attributes to significant stressors, mainly work related . 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 States " the pressure has been so much, I find myself withdrawing more than engaging ". Over the last week he has been experiencing some suicidal ideations, with thoughts of crashing his vehicle. He also has had episodes where he has banged head on a wall due to feeling intensely anxious. Endorses neuro-vegetative symptoms as below. States insomnia has been a major issue and states " even though I go to bed at 8 pm every night, I am getting 3 hours of sleep". In addition to neuro-vegetative symptoms also describes increased anxiety and frequent panic symptoms.  Chemical Use History: Reports occasional social drinking.  Family of Origin Issues: Reported that his wife and children continue to be supportive. Reported some martial strain.  Progress in Program Toward Treatment Goals: Scott Sanchez attended and participated with daily group sessions with active and engaged participation.  Progress (rationale): [facility] with physician assistant Scott Sanchez and therapist Scott Sanchez.  -Trazodone was resent to pharmacy as requested by patient second request to pharmacy.  Take all medications as prescribed. Keep all follow-up appointments as scheduled.  Do not consume alcohol or use illegal drugs while on prescription medications. Report any adverse effects from your medications to  your primary care provider promptly.  In the event of recurrent symptoms or worsening symptoms, call 911, a crisis hotline, or go to the nearest emergency department for evaluation.   Oneta Rack, NP 02/16/2019

## 2019-02-16 NOTE — Progress Notes (Signed)
Patient had a request for medications to be resent to pharmacy.  NP E- prescribe medications to [facility] on [address] again. On 02/16/2019. 09:50. Spoke to patient who reported he was unsure if the pharmacy had a valid prescriptions. - medication was resent at 09:50

## 2019-02-16 NOTE — Progress Notes (Signed)
    Daily Group Progress Note  Program: IOP  Group Time: 9am-12pm  Participation Level: Active  Behavioral Response: Appropriate  Type of Therapy:  Group Therapy; process group, psycho-educational group  Summary of Progress:  The purpose of this group is to utilize CBT and DBT skills in a group setting to increase use of healthy coping skills and decrease frequency and intensity of active mental health symptoms. 9am-10:30am Clinician checked in with group members, assessing for SI/HI/psychosis. Clinician and group members discussed stressors and skills from the weekend. Clinician actively listened to clients, utilizing summarizing and re-framing statements. Clinician and group members discussed motivation, and ways to improve motivation to get out of bed, even with limited desire to complete tasks. Clinician and group members processed the vulnerability that comes with asking for help, especially when it is a new skill. Group members processed reasons for seeking and accepting help at this time. Clinician lead mindful stretching exercise.  10:30am-12pm Clinician presented the topic of Growth Mindset. Clinician and group members discussed adapting core beliefs and focusing on avoiding black and white thinking. Clinician praised group members for identifying cognitive distortions they are actively working on addressing. Clinician and group members discussed the struggle being receptive to help after avoiding and minimizing symptoms. Clinician presented clients with positive affirmations, and requested that group members share an affirmation they believe to be true related to themselves. Client engaged in group discussions and activities. Client identifies she continues to struggle with completing tasks on to do list and is still concerned about what to say about his time off when he returns to work. Client notes the stigma of getting treatment is a primary concern of how much information to disclose.    Harlon Ditty, LCSW

## 2019-02-16 NOTE — Progress Notes (Signed)
    Daily Group Progress Note  Program: IOP  Group Time: 9am-12pm  Participation Level: Active  Behavioral Response: Appropriate  Type of Therapy: Group Therapy; process group, psycho-educational group ? Summary of Progress:  The purpose of this group is to utilize CBT and DBT skills to increase use of healthy coping skills and decrease frequency and intensity of active mental health symptoms.  9am-10:30am Clinician checked in with group members, assessing for SI/HI/psychosis and overall level of functioning. Clinician inquired about completed self care activities and any skills practiced since last group. Clinician presented the topic of Healthy Boundaries. Clinician and group members discussed what boundaries means and common types of boundaries. Clinician and group members processed factors in setting and maintaining boundaries including relationship, situation, and previous response to boundaries. Clinician presented to group My Personal Bill of Rights and processed difficulty changing mindset to allow for healthy personal boundaries.  10:30am-12pm Clinician and clients discussed types of boundaries, and signs of healthy and unhealthy boundaries. Clinician and group members discussed how to define and maintain healthy boundaries with self, others and food, substances, or time. Clinician and group members processed struggle with codependency and feelings of low self worth and the effect on identifying and maintaining healthy boundaries. Clinician and group members role played in session identifying specific feelings related to a situation, the thoughts causing those feelings, and identifying alternative thoughts without previously learned cognitive distortions. Clinician requested group members check out with something they are grateful for and a planned self care activity for the day. Client engaged in discussions and activities. Client provided supportive feedback to others. Client noted some  frustration with the changes in schedule at home. Client identified where he recently set a boundary with a co-worker by not responding about work on social sites. Client notes it is difficult to set boundaries with time and work and is working on Psychologist, prison and probation services.  Harlon Ditty, LCSW

## 2019-02-17 ENCOUNTER — Other Ambulatory Visit (HOSPITAL_COMMUNITY): Payer: 59 | Admitting: Licensed Clinical Social Worker

## 2019-02-17 ENCOUNTER — Other Ambulatory Visit: Payer: Self-pay

## 2019-02-17 DIAGNOSIS — F411 Generalized anxiety disorder: Secondary | ICD-10-CM

## 2019-02-17 DIAGNOSIS — F320 Major depressive disorder, single episode, mild: Secondary | ICD-10-CM | POA: Diagnosis not present

## 2019-02-17 NOTE — Patient Instructions (Signed)
D:  Patient completed MH-IOP today.  A:  Discharge today.  Follow up with Jana Half 02-18-19 and Leta Jungling (therapist) on 02-24-19.  Both providers are at [facility].  Encourage support groups.  Return to work on 03-03-19, without any restrictions.  R:  Patient receptive.

## 2019-02-17 NOTE — Progress Notes (Signed)
Scott Sanchez is a 43 y.o. , married, employed, Caucasian male who was transitioned from the inpatient unit at [facility].  Pt was admitted there from  01-21-19 thru 01-26-19; treatment for worsening anxiety and depressive symptoms with SI (plan: wreck car).  Pt denies any prior suicide attempts/gestures.  Currently denies SI.  Also, denies HI or A/V hallucinations.  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  States that three people report to him.  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  I've been working a lot of long hours.  There are a lot of demands and my performance has gone down."  Pt also reports there's been a lot of new coworkers.  Pt states he found himself thinking about work all the time; even now although he has been out on short term disability.  Denies any prior psychiatric admissions.  At age 56 saw a therapist when his parents were divorcing.  Family hx:  Great GF (alcoholic).   Pt successfully completed MH-IOP today.  Reports overall mood has improved.  Admission PHQ-9 was 19 and Discharge score was 2.   Continues to deny any SI/HI or A/V hallucinations.  Anxious about returning to work on 03-03-19.  A:  D/C today.  Follow up with providers at [facility]:  Jana Half on 02-19-19 and Jake (therapist) on 02-24-19.  Encouraged support groups through [facility].  R:  Pt receptive.                                        Chestine Spore, RITA, M.Ed, CNA

## 2019-02-17 NOTE — Progress Notes (Signed)
    Daily Group Progress Note  Program: IOP  Group Time: 9am-12pm  Participation Level: Active  Behavioral Response: Appropriate  Type of Therapy: Group Therapy; process group, psycho-educational group Summary of Progress: The purpose of this group is to utilize CBT and DBT skills in a group setting to increase use of healthy coping skills and decrease frequency and intensity of active mental health symptoms.  9am-11am: Clinician provided psycho-educational information related to mindfulness and relationship between emotions and eating. Clinician and group members brainstormed options for other coping skills to use in place of eating, and the benefits of mindful eating. Clinician presented the skill of EFT or Tapping, focused on acknowledging problems and emotions, and showing self compassion and acceptance. Clinician and group members processed how they learned to identify, share, and cope with emotions growing up. Clinician and group members processed how what was modeled when they were younger had an effect on relationships with people and emotions as adults.  11am-12pm Co-facilitator for psycho-educational group from [facility]. Guest provided psycho-educational information on health and wellness in relation to mental health. Topics included sleep hygiene, healthy eating habits, and benefits to exercise.  Client reports he has started to add physical activity into his daily routine since starting treatment however is concerned how this will fit into his daily schedule after returning to work. Client reports feeling overall improved mood but is still anxious about returning to work and maintaining progress.   Harlon Ditty, LCSW

## 2019-02-18 ENCOUNTER — Other Ambulatory Visit (HOSPITAL_COMMUNITY): Payer: 59

## 2019-02-18 NOTE — Progress Notes (Signed)
    Daily Group Progress Note  Program: IOP  Group Time: 9am-12pm  Participation Level: Active  Behavioral Response: Appropriate  Type of Therapy:  Group Therapy; process group, psycho-educational skills group  Summary of Progress:  The purpose of this group is to utilize CBT and DBT skills in a group setting to increase use of healthy coping skills and decrease frequency and intensity of active mental health symptoms.  9am-10:30am Clinician checked in with group members, assessing for SI/HI/psychosis and overall level of functioning. Clinician inquired about recent stressors and skills used in response. Clinician presented the topic of Anger Management. Clinician and group members processed anger as a secondary emotion and identified emotions which can often present as anger. Clinician and group members processed what messages clients received growing up about anger and acceptable ways to express anger. Clinician and group members discussed what other messages clients received about identifying and showing feelings from parents.  10:30am-12pm Clinician lead mindfulness activity for group members and inquired about overall effectiveness. Clinician and group members created tactile fidget which could be used as part of a sensory mindfulness activity in the community.  Client engaged in group discussions and activities. Client found mindfulness activity helpful, however did not find tactile fidget as effective.  Harlon Ditty, LCSW

## 2019-02-19 ENCOUNTER — Other Ambulatory Visit (HOSPITAL_COMMUNITY): Payer: 59

## 2019-02-22 ENCOUNTER — Other Ambulatory Visit (HOSPITAL_COMMUNITY): Payer: 59

## 2019-02-23 ENCOUNTER — Other Ambulatory Visit (HOSPITAL_COMMUNITY): Payer: 59

## 2019-02-24 ENCOUNTER — Other Ambulatory Visit (HOSPITAL_COMMUNITY): Payer: 59

## 2019-02-25 ENCOUNTER — Other Ambulatory Visit (HOSPITAL_COMMUNITY): Payer: 59

## 2019-02-26 ENCOUNTER — Other Ambulatory Visit (HOSPITAL_COMMUNITY): Payer: 59

## 2019-03-01 ENCOUNTER — Other Ambulatory Visit (HOSPITAL_COMMUNITY): Payer: 59

## 2019-03-02 ENCOUNTER — Other Ambulatory Visit (HOSPITAL_COMMUNITY): Payer: 59

## 2019-03-03 ENCOUNTER — Other Ambulatory Visit (HOSPITAL_COMMUNITY): Payer: 59

## 2019-03-04 ENCOUNTER — Other Ambulatory Visit (HOSPITAL_COMMUNITY): Payer: 59

## 2019-03-05 ENCOUNTER — Other Ambulatory Visit (HOSPITAL_COMMUNITY): Payer: 59

## 2019-03-08 ENCOUNTER — Other Ambulatory Visit (HOSPITAL_COMMUNITY): Payer: 59

## 2019-03-09 ENCOUNTER — Other Ambulatory Visit (HOSPITAL_COMMUNITY): Payer: 59

## 2019-03-10 ENCOUNTER — Other Ambulatory Visit (HOSPITAL_COMMUNITY): Payer: 59

## 2019-03-11 ENCOUNTER — Other Ambulatory Visit (HOSPITAL_COMMUNITY): Payer: 59

## 2019-03-12 ENCOUNTER — Other Ambulatory Visit (HOSPITAL_COMMUNITY): Payer: 59

## 2019-03-15 ENCOUNTER — Other Ambulatory Visit (HOSPITAL_COMMUNITY): Payer: 59

## 2019-03-16 ENCOUNTER — Other Ambulatory Visit (HOSPITAL_COMMUNITY): Payer: 59

## 2019-03-17 ENCOUNTER — Other Ambulatory Visit (HOSPITAL_COMMUNITY): Payer: 59

## 2019-03-18 ENCOUNTER — Other Ambulatory Visit (HOSPITAL_COMMUNITY): Payer: 59

## 2019-03-19 ENCOUNTER — Other Ambulatory Visit (HOSPITAL_COMMUNITY): Payer: 59

## 2019-03-22 ENCOUNTER — Other Ambulatory Visit (HOSPITAL_COMMUNITY): Payer: 59

## 2019-03-23 ENCOUNTER — Other Ambulatory Visit (HOSPITAL_COMMUNITY): Payer: 59

## 2019-03-24 ENCOUNTER — Other Ambulatory Visit (HOSPITAL_COMMUNITY): Payer: 59

## 2019-03-25 ENCOUNTER — Other Ambulatory Visit (HOSPITAL_COMMUNITY): Payer: 59

## 2019-03-26 ENCOUNTER — Other Ambulatory Visit (HOSPITAL_COMMUNITY): Payer: 59

## 2019-03-29 ENCOUNTER — Other Ambulatory Visit (HOSPITAL_COMMUNITY): Payer: 59

## 2019-03-30 ENCOUNTER — Other Ambulatory Visit (HOSPITAL_COMMUNITY): Payer: 59

## 2019-08-02 ENCOUNTER — Encounter: Payer: Self-pay | Admitting: Family Medicine

## 2019-08-02 ENCOUNTER — Other Ambulatory Visit: Payer: Self-pay | Admitting: Family Medicine

## 2019-08-02 ENCOUNTER — Ambulatory Visit (INDEPENDENT_AMBULATORY_CARE_PROVIDER_SITE_OTHER): Payer: 59 | Admitting: Family Medicine

## 2019-08-02 ENCOUNTER — Other Ambulatory Visit: Payer: Self-pay

## 2019-08-02 VITALS — BP 102/69 | HR 57 | Temp 97.7°F | Resp 12 | Ht 70.5 in | Wt 232.8 lb

## 2019-08-02 DIAGNOSIS — Z1211 Encounter for screening for malignant neoplasm of colon: Secondary | ICD-10-CM

## 2019-08-02 DIAGNOSIS — Z23 Encounter for immunization: Secondary | ICD-10-CM

## 2019-08-02 DIAGNOSIS — R7989 Other specified abnormal findings of blood chemistry: Secondary | ICD-10-CM

## 2019-08-02 DIAGNOSIS — Z125 Encounter for screening for malignant neoplasm of prostate: Secondary | ICD-10-CM

## 2019-08-02 DIAGNOSIS — Z0000 Encounter for general adult medical examination without abnormal findings: Secondary | ICD-10-CM

## 2019-08-02 LAB — LIPID PANEL
Cholesterol: 166 mg/dL (ref 0–200)
HDL: 46.5 mg/dL (ref >39.00)
LDL Cholesterol: 92 mg/dL (ref 0–99)
NonHDL: 119.05
Total CHOL/HDL Ratio: 4
Triglycerides: 133 mg/dL (ref 0.0–149.0)
VLDL: 26.6 mg/dL (ref 0.0–40.0)

## 2019-08-02 LAB — CBC WITH DIFFERENTIAL/PLATELET
Basophils Absolute: 0 10*3/uL (ref 0.0–0.1)
Basophils Relative: 1.1 % (ref 0.0–3.0)
Eosinophils Absolute: 0.4 10*3/uL (ref 0.0–0.7)
Eosinophils Relative: 9.4 % — ABNORMAL HIGH (ref 0.0–5.0)
HCT: 47.5 % (ref 39.0–52.0)
Hemoglobin: 16.4 g/dL (ref 13.0–17.0)
Lymphocytes Relative: 43.4 % (ref 12.0–46.0)
Lymphs Abs: 1.9 10*3/uL (ref 0.7–4.0)
MCHC: 34.5 g/dL (ref 30.0–36.0)
MCV: 88.9 fl (ref 78.0–100.0)
Monocytes Absolute: 0.4 10*3/uL (ref 0.1–1.0)
Monocytes Relative: 8.8 % (ref 3.0–12.0)
Neutro Abs: 1.6 10*3/uL (ref 1.4–7.7)
Neutrophils Relative %: 37.3 % — ABNORMAL LOW (ref 43.0–77.0)
Platelets: 195 10*3/uL (ref 150.0–400.0)
RBC: 5.34 Mil/uL (ref 4.22–5.81)
RDW: 13.1 % (ref 11.5–15.5)
WBC: 4.3 10*3/uL (ref 4.0–10.5)

## 2019-08-02 LAB — COMPREHENSIVE METABOLIC PANEL
ALT: 21 U/L (ref 0–53)
AST: 17 U/L (ref 0–37)
Albumin: 4.5 g/dL (ref 3.5–5.2)
Alkaline Phosphatase: 91 U/L (ref 39–117)
BUN: 15 mg/dL (ref 6–23)
CO2: 31 mEq/L (ref 19–32)
Calcium: 9.5 mg/dL (ref 8.4–10.5)
Chloride: 102 mEq/L (ref 96–112)
Creatinine, Ser: 1.06 mg/dL (ref 0.40–1.50)
GFR: 75.39 mL/min (ref >60.00)
Glucose, Bld: 77 mg/dL (ref 70–99)
Potassium: 4.3 mEq/L (ref 3.5–5.1)
Sodium: 139 mEq/L (ref 135–145)
Total Bilirubin: 1.2 mg/dL (ref 0.2–1.2)
Total Protein: 6.8 g/dL (ref 6.0–8.3)

## 2019-08-02 LAB — POC HEMOCCULT BLD/STL (OFFICE/1-CARD/DIAGNOSTIC)
Card #1 Date: 8312020
Fecal Occult Blood, POC: NEGATIVE

## 2019-08-02 LAB — PSA: PSA: 0.2 ng/mL (ref 0.10–4.00)

## 2019-08-02 LAB — TSH: TSH: 4.73 u[IU]/mL — ABNORMAL HIGH (ref 0.35–4.50)

## 2019-08-02 NOTE — Patient Instructions (Signed)
Preventive Care 19-45 Years Old, Male Preventive care refers to lifestyle choices and visits with your health care provider that can promote health and wellness. This includes:  A yearly physical exam. This is also called an annual well check.  Regular dental and eye exams.  Immunizations.  Screening for certain conditions.  Healthy lifestyle choices, such as eating a healthy diet, getting regular exercise, not using drugs or products that contain nicotine and tobacco, and limiting alcohol use. What can I expect for my preventive care visit? Physical exam Your health care provider will check:  Height and weight. These may be used to calculate body mass index (BMI), which is a measurement that tells if you are at a healthy weight.  Heart rate and blood pressure.  Your skin for abnormal spots. Counseling Your health care provider may ask you questions about:  Alcohol, tobacco, and drug use.  Emotional well-being.  Home and relationship well-being.  Sexual activity.  Eating habits.  Work and work Statistician. What immunizations do I need?  Influenza (flu) vaccine  This is recommended every year. Tetanus, diphtheria, and pertussis (Tdap) vaccine  You may need a Td booster every 10 years. Varicella (chickenpox) vaccine  You may need this vaccine if you have not already been vaccinated. Human papillomavirus (HPV) vaccine  If recommended by your health care provider, you may need three doses over 6 months. Measles, mumps, and rubella (MMR) vaccine  You may need at least one dose of MMR. You may also need a second dose. Meningococcal conjugate (MenACWY) vaccine  One dose is recommended if you are 45-76 years old and a Market researcher living in a residence hall, or if you have one of several medical conditions. You may also need additional booster doses. Pneumococcal conjugate (PCV13) vaccine  You may need this if you have certain conditions and were not  previously vaccinated. Pneumococcal polysaccharide (PPSV23) vaccine  You may need one or two doses if you smoke cigarettes or if you have certain conditions. Hepatitis A vaccine  You may need this if you have certain conditions or if you travel or work in places where you may be exposed to hepatitis A. Hepatitis B vaccine  You may need this if you have certain conditions or if you travel or work in places where you may be exposed to hepatitis B. Haemophilus influenzae type b (Hib) vaccine  You may need this if you have certain risk factors. You may receive vaccines as individual doses or as more than one vaccine together in one shot (combination vaccines). Talk with your health care provider about the risks and benefits of combination vaccines. What tests do I need? Blood tests  Lipid and cholesterol levels. These may be checked every 5 years starting at age 17.  Hepatitis C test.  Hepatitis B test. Screening   Diabetes screening. This is done by checking your blood sugar (glucose) after you have not eaten for a while (fasting).  Sexually transmitted disease (STD) testing. Talk with your health care provider about your test results, treatment options, and if necessary, the need for more tests. Follow these instructions at home: Eating and drinking   Eat a diet that includes fresh fruits and vegetables, whole grains, lean protein, and low-fat dairy products.  Take vitamin and mineral supplements as recommended by your health care provider.  Do not drink alcohol if your health care provider tells you not to drink.  If you drink alcohol: ? Limit how much you have to 0-2  drinks a day. ? Be aware of how much alcohol is in your drink. In the [place] one drink equals one 12 oz bottle of beer (355 mL), one 5 oz glass of wine (148 mL), or one 1 oz glass of hard liquor (44 mL). Lifestyle  Take daily care of your teeth and gums.  Stay active. Exercise for at least 30 minutes on 5 or  more days each week.  Do not use any products that contain nicotine or tobacco, such as cigarettes, e-cigarettes, and chewing tobacco. If you need help quitting, ask your health care provider.  If you are sexually active, practice safe sex. Use a condom or other form of protection to prevent STIs (sexually transmitted infections). What's next?  Go to your health care provider once a year for a well check visit.  Ask your health care provider how often you should have your eyes and teeth checked.  Stay up to date on all vaccines. This information is not intended to replace advice given to you by your health care provider. Make sure you discuss any questions you have with your health care provider. Document Released: 01/14/2002 Document Revised: 11/12/2018 Document Reviewed: 11/12/2018 Elsevier Patient Education  2020 [employer]  

## 2019-08-02 NOTE — Addendum Note (Signed)
Addended by: Kem Boroughs D on: 08/02/2019 09:22 AM   Modules accepted: Orders

## 2019-08-02 NOTE — Progress Notes (Signed)
Patient ID: Scott Sanchez, male    DOB: 05/18/1976  Age: 43 y.o. MRN: [id number]    Subjective:  Subjective  HPI [name] presents for cpe  No complaints.  He is really doing well since [facility] hospital stay [facility].   [facility]    Review of Systems  Constitutional: Negative.  Negative for appetite change, diaphoresis, fatigue and unexpected weight change.  HENT: Negative for congestion, ear pain, hearing loss, nosebleeds, postnasal drip, rhinorrhea, sinus pressure, sneezing and tinnitus.   Eyes: Negative for photophobia, pain, discharge, redness, itching and visual disturbance.  Respiratory: Negative.  Negative for cough, chest tightness, shortness of breath and wheezing.   Cardiovascular: Negative.  Negative for chest pain, palpitations and leg swelling.  Gastrointestinal: Negative for abdominal distention, abdominal pain, anal bleeding, blood in stool and constipation.  Endocrine: Negative.  Negative for cold intolerance, heat intolerance, polydipsia, polyphagia and polyuria.  Genitourinary: Negative.  Negative for difficulty urinating, dysuria and frequency.  Musculoskeletal: Negative.   Skin: Negative.   Allergic/Immunologic: Negative.   Neurological: Negative for dizziness, weakness, light-headedness, numbness and headaches.  Psychiatric/Behavioral: Negative for agitation, confusion, decreased concentration, dysphoric mood, sleep disturbance and suicidal ideas. The patient is not nervous/anxious.     History Past Medical History:  Diagnosis Date  . Anxiety   . Depression   . History of kidney stones     He has a past surgical history that includes Kidney stone surgery (y-3); Lithotripsy (2008); and Extracorporeal shock wave lithotripsy (Left, 01/30/2017).   His family history includes Alcohol abuse in an other family member; Diabetes Mellitus II in his father; Hyperlipidemia in his father.He reports that he  has never smoked. He has never used smokeless tobacco. He reports current alcohol use. He reports that he does not use drugs.  Current Outpatient Medications on File Prior to Visit  Medication Sig Dispense Refill  . escitalopram (LEXAPRO) 20 MG tablet Take 1 tablet by mouth daily.    Marland Kitchen zolpidem (AMBIEN) 5 MG tablet Take 1 tablet by mouth daily.     No current facility-administered medications on file prior to visit.      Objective:  Objective  Physical Exam Vitals signs and nursing note reviewed.  Constitutional:      General: He is sleeping. He is not in acute distress.    Appearance: He is well-developed. He is not diaphoretic.  HENT:     Head: Normocephalic and atraumatic.     Right Ear: External ear normal.     Left Ear: External ear normal.     Nose: Nose normal.     Mouth/Throat:     Pharynx: No oropharyngeal exudate.  Eyes:     General:        Right eye: No discharge.        Left eye: No discharge.     Conjunctiva/sclera: Conjunctivae normal.     Pupils: Pupils are equal, round, and reactive to light.  Neck:     Musculoskeletal: Normal range of motion and neck supple.     Thyroid: No thyromegaly.     Vascular: No JVD.  Cardiovascular:     Rate and Rhythm: Normal rate and regular rhythm.     Heart sounds: No murmur. No friction rub. No gallop.   Pulmonary:     Effort: Pulmonary effort is normal. No respiratory distress.     Breath sounds: Normal breath sounds. No wheezing or  rales.  Chest:     Chest wall: No tenderness.  Abdominal:     General: Bowel sounds are normal. There is no distension.     Palpations: Abdomen is soft. There is no mass.     Tenderness: There is no abdominal tenderness. There is no guarding or rebound.  Genitourinary:    Penis: Normal.      Prostate: Normal.     Rectum: Normal. Guaiac result negative.  Musculoskeletal: Normal range of motion.        General: No tenderness.  Lymphadenopathy:     Cervical: No cervical adenopathy.  Skin:     General: Skin is warm and dry.     Coloration: Skin is not pale.     Findings: No erythema or rash.  Neurological:     Mental Status: He is oriented to person, place, and time.     Motor: No abnormal muscle tone.     Deep Tendon Reflexes: Reflexes are normal and symmetric. Reflexes normal.  Psychiatric:        Behavior: Behavior normal.        Thought Content: Thought content normal.        Judgment: Judgment normal.    BP 102/69 (BP Location: Left Arm, Cuff Size: Large)   Pulse (!) 57   Temp 97.7 F (36.5 C) (Temporal)   Resp 12   Ht 5' 10.5" (1.791 m)   Wt 232 lb 12.8 oz (105.6 kg)   SpO2 98%   BMI 32.93 kg/m  Wt Readings from Last 3 Encounters:  08/02/19 232 lb 12.8 oz (105.6 kg)  01/21/19 226 lb (102.5 kg)  12/29/18 237 lb (107.5 kg)     Lab Results  Component Value Date   WBC 4.4 01/22/2019   HGB 16.5 01/22/2019   HCT 50.2 01/22/2019   PLT 276 01/22/2019   GLUCOSE 93 01/22/2019   CHOL 134 01/22/2019   TRIG 97 01/22/2019   HDL 41 01/22/2019   LDLCALC 74 01/22/2019   ALT 114 (H) 01/22/2019   AST 53 (H) 01/22/2019   NA 136 01/22/2019   K 3.8 01/22/2019   CL 104 01/22/2019   CREATININE 1.00 01/22/2019   BUN 12 01/22/2019   CO2 22 01/22/2019   TSH 3.667 01/22/2019   PSA 0.19 07/31/2018   HGBA1C 4.6 (L) 01/22/2019   MICROALBUR 2.1 (H) 06/26/2015    No results found.   Assessment & Plan:  Plan  I have discontinued Ghassan W. Skolnick's LORazepam and traZODone. I am also having him maintain his escitalopram and zolpidem.  No orders of the defined types were placed in this encounter.   Problem List Items Addressed This Visit      Unprioritized   Preventative health care - Primary    ghm utd Flu shot given Check labs  See AVS      Relevant Orders   PSA   TSH   CBC with Differential/Platelet   Lipid panel   Comprehensive metabolic panel      Follow-up: Return in about 1 year (around 08/01/2020) for annual exam, fasting.  Donato SchultzYvonne R Lowne  Chase, DO    -

## 2019-08-02 NOTE — Assessment & Plan Note (Signed)
ghm utd Flu shot given Check labs  See AVS

## 2020-06-19 ENCOUNTER — Telehealth: Payer: Self-pay | Admitting: Family Medicine

## 2020-06-19 NOTE — Telephone Encounter (Signed)
Called to re sch appt for 08/04/20 . Patient informed me he moved and is looking for another provider / 7/19/21dt

## 2020-06-19 NOTE — Telephone Encounter (Signed)
FYI

## 2020-08-04 ENCOUNTER — Encounter: Payer: 59 | Admitting: Family Medicine
